# Patient Record
Sex: Female | Born: 1970 | Race: Black or African American | Hispanic: No | Marital: Married | Smoking: Former smoker
Health system: Southern US, Community
[De-identification: ages and names within clinical notes are randomized; demographics above are authoritative.]

## PROBLEM LIST (undated history)

## (undated) DIAGNOSIS — F32A Depression, unspecified: Secondary | ICD-10-CM

## (undated) DIAGNOSIS — R5383 Other fatigue: Secondary | ICD-10-CM

## (undated) DIAGNOSIS — T401X1A Poisoning by heroin, accidental (unintentional), initial encounter: Secondary | ICD-10-CM

## (undated) DIAGNOSIS — M1711 Unilateral primary osteoarthritis, right knee: Secondary | ICD-10-CM

## (undated) DIAGNOSIS — Z1211 Encounter for screening for malignant neoplasm of colon: Secondary | ICD-10-CM

## (undated) DIAGNOSIS — L309 Dermatitis, unspecified: Secondary | ICD-10-CM

## (undated) DIAGNOSIS — K219 Gastro-esophageal reflux disease without esophagitis: Secondary | ICD-10-CM

## (undated) DIAGNOSIS — M79606 Pain in leg, unspecified: Secondary | ICD-10-CM

## (undated) DIAGNOSIS — K259 Gastric ulcer, unspecified as acute or chronic, without hemorrhage or perforation: Secondary | ICD-10-CM

## (undated) DIAGNOSIS — I10 Essential (primary) hypertension: Secondary | ICD-10-CM

## (undated) DIAGNOSIS — F419 Anxiety disorder, unspecified: Secondary | ICD-10-CM

## (undated) DIAGNOSIS — N390 Urinary tract infection, site not specified: Secondary | ICD-10-CM

## (undated) DIAGNOSIS — E049 Nontoxic goiter, unspecified: Secondary | ICD-10-CM

## (undated) DIAGNOSIS — U071 COVID-19: Secondary | ICD-10-CM

## (undated) DIAGNOSIS — T7840XA Allergy, unspecified, initial encounter: Secondary | ICD-10-CM

## (undated) DIAGNOSIS — R0683 Snoring: Secondary | ICD-10-CM

## (undated) DIAGNOSIS — J111 Influenza due to unidentified influenza virus with other respiratory manifestations: Secondary | ICD-10-CM

## (undated) DIAGNOSIS — B977 Papillomavirus as the cause of diseases classified elsewhere: Secondary | ICD-10-CM

## (undated) DIAGNOSIS — Z8619 Personal history of other infectious and parasitic diseases: Secondary | ICD-10-CM

## (undated) DIAGNOSIS — R519 Headache, unspecified: Secondary | ICD-10-CM

## (undated) DIAGNOSIS — G4700 Insomnia, unspecified: Secondary | ICD-10-CM

## (undated) DIAGNOSIS — F329 Major depressive disorder, single episode, unspecified: Secondary | ICD-10-CM

## (undated) DIAGNOSIS — R51 Headache: Secondary | ICD-10-CM

## (undated) DIAGNOSIS — J45909 Unspecified asthma, uncomplicated: Secondary | ICD-10-CM

## (undated) HISTORY — DX: Insomnia, unspecified: G47.00

## (undated) HISTORY — DX: Gastric ulcer, unspecified as acute or chronic, without hemorrhage or perforation: K25.9

## (undated) HISTORY — DX: COVID-19: U07.1

## (undated) HISTORY — DX: Headache: R51

## (undated) HISTORY — DX: Gastro-esophageal reflux disease without esophagitis: K21.9

## (undated) HISTORY — DX: Allergy, unspecified, initial encounter: T78.40XA

## (undated) HISTORY — DX: Dermatitis, unspecified: L30.9

## (undated) HISTORY — PX: GASTRIC BYPASS: SHX52

## (undated) HISTORY — DX: Headache, unspecified: R51.9

## (undated) HISTORY — PX: ROTATOR CUFF REPAIR: SHX139

## (undated) HISTORY — DX: Nontoxic goiter, unspecified: E04.9

## (undated) HISTORY — DX: Snoring: R06.83

## (undated) HISTORY — DX: Encounter for screening for malignant neoplasm of colon: Z12.11

## (undated) HISTORY — DX: Poisoning by heroin, accidental (unintentional), initial encounter: T40.1X1A

## (undated) HISTORY — DX: Pain in leg, unspecified: M79.606

## (undated) HISTORY — DX: Influenza due to unidentified influenza virus with other respiratory manifestations: J11.1

## (undated) HISTORY — PX: CYSTECTOMY: SUR359

## (undated) HISTORY — DX: Urinary tract infection, site not specified: N39.0

## (undated) HISTORY — DX: Personal history of other infectious and parasitic diseases: Z86.19

## (undated) HISTORY — DX: Papillomavirus as the cause of diseases classified elsewhere: B97.7

## (undated) HISTORY — DX: Other fatigue: R53.83

## (undated) HISTORY — DX: Unilateral primary osteoarthritis, right knee: M17.11

---

## 1997-09-19 ENCOUNTER — Other Ambulatory Visit: Admission: RE | Admit: 1997-09-19 | Discharge: 1997-09-19 | Payer: Self-pay | Admitting: Family Medicine

## 1998-03-06 ENCOUNTER — Other Ambulatory Visit: Admission: RE | Admit: 1998-03-06 | Discharge: 1998-03-06 | Payer: Self-pay | Admitting: Obstetrics and Gynecology

## 1998-04-19 ENCOUNTER — Ambulatory Visit (HOSPITAL_COMMUNITY): Admission: RE | Admit: 1998-04-19 | Discharge: 1998-04-19 | Payer: Self-pay | Admitting: Family Medicine

## 1998-04-19 ENCOUNTER — Encounter: Payer: Self-pay | Admitting: Family Medicine

## 1998-08-06 ENCOUNTER — Other Ambulatory Visit: Admission: RE | Admit: 1998-08-06 | Discharge: 1998-08-06 | Payer: Self-pay | Admitting: Obstetrics and Gynecology

## 1999-09-11 ENCOUNTER — Other Ambulatory Visit: Admission: RE | Admit: 1999-09-11 | Discharge: 1999-09-11 | Payer: Self-pay | Admitting: Obstetrics and Gynecology

## 2003-06-22 ENCOUNTER — Other Ambulatory Visit: Admission: RE | Admit: 2003-06-22 | Discharge: 2003-06-22 | Payer: Self-pay | Admitting: Obstetrics and Gynecology

## 2004-01-24 ENCOUNTER — Ambulatory Visit: Payer: Self-pay | Admitting: Family Medicine

## 2005-07-13 ENCOUNTER — Emergency Department (HOSPITAL_COMMUNITY): Admission: EM | Admit: 2005-07-13 | Discharge: 2005-07-13 | Payer: Self-pay | Admitting: Family Medicine

## 2006-08-22 ENCOUNTER — Inpatient Hospital Stay (HOSPITAL_COMMUNITY): Admission: AD | Admit: 2006-08-22 | Discharge: 2006-08-22 | Payer: Self-pay | Admitting: Obstetrics and Gynecology

## 2006-10-28 ENCOUNTER — Encounter: Admission: RE | Admit: 2006-10-28 | Discharge: 2006-11-10 | Payer: Self-pay | Admitting: Obstetrics and Gynecology

## 2006-12-04 ENCOUNTER — Observation Stay (HOSPITAL_COMMUNITY): Admission: AD | Admit: 2006-12-04 | Discharge: 2006-12-05 | Payer: Self-pay | Admitting: Obstetrics and Gynecology

## 2006-12-08 ENCOUNTER — Inpatient Hospital Stay (HOSPITAL_COMMUNITY): Admission: AD | Admit: 2006-12-08 | Discharge: 2006-12-08 | Payer: Self-pay | Admitting: Obstetrics and Gynecology

## 2006-12-11 ENCOUNTER — Inpatient Hospital Stay (HOSPITAL_COMMUNITY): Admission: AD | Admit: 2006-12-11 | Discharge: 2006-12-11 | Payer: Self-pay | Admitting: Obstetrics and Gynecology

## 2006-12-17 ENCOUNTER — Inpatient Hospital Stay (HOSPITAL_COMMUNITY): Admission: RE | Admit: 2006-12-17 | Discharge: 2006-12-17 | Payer: Self-pay | Admitting: Obstetrics and Gynecology

## 2006-12-18 ENCOUNTER — Inpatient Hospital Stay (HOSPITAL_COMMUNITY): Admission: AD | Admit: 2006-12-18 | Discharge: 2006-12-18 | Payer: Self-pay | Admitting: Obstetrics and Gynecology

## 2006-12-26 ENCOUNTER — Inpatient Hospital Stay (HOSPITAL_COMMUNITY): Admission: AD | Admit: 2006-12-26 | Discharge: 2006-12-31 | Payer: Self-pay | Admitting: Obstetrics and Gynecology

## 2006-12-27 ENCOUNTER — Encounter (INDEPENDENT_AMBULATORY_CARE_PROVIDER_SITE_OTHER): Payer: Self-pay | Admitting: Obstetrics and Gynecology

## 2007-03-01 ENCOUNTER — Encounter: Admission: RE | Admit: 2007-03-01 | Discharge: 2007-03-01 | Payer: Self-pay | Admitting: Internal Medicine

## 2009-08-31 ENCOUNTER — Emergency Department (HOSPITAL_COMMUNITY): Admission: EM | Admit: 2009-08-31 | Discharge: 2009-08-31 | Payer: Self-pay | Admitting: Emergency Medicine

## 2009-10-08 ENCOUNTER — Encounter: Admission: RE | Admit: 2009-10-08 | Discharge: 2009-10-08 | Payer: Self-pay | Admitting: Obstetrics and Gynecology

## 2010-04-22 ENCOUNTER — Encounter: Payer: Self-pay | Admitting: Obstetrics and Gynecology

## 2010-05-01 HISTORY — PX: MULTIPLE TOOTH EXTRACTIONS: SHX2053

## 2010-08-13 NOTE — H&P (Signed)
NAME:  Adrienne Savage, Adrienne Savage                 ACCOUNT NO.:  [id number]   MEDICAL RECORD NO.:  [id number]          PATIENT TYPE:  INP   LOCATION:  9152                          FACILITY:  [facility]   PHYSICIAN:  Osborn Coho, M.D.   DATE OF BIRTH:  November 07, 1970   DATE OF ADMISSION:  12/04/2006  DATE OF DISCHARGE:                              HISTORY & PHYSICAL   This is a 40 year old gravida 4, para 1, 0-2-0 at 74 4/7 weeks with  gestational diabetes on glyburide with fetal testing in the office BPP  of 4/10.  Patient was sent here for further testing and close  observation.  She reports feeling occasional contractions, no bleeding.  She has had congestion and cough for two days.  Pregnancy has been  followed by Dr. Stefano Gaul and remarkable for:  1. ANA.  2. Obesity.  3. Smoker.  4. History of anxiety.  5. History of abnormal Pap with cryo.  6. History of stillbirth at term.  7. History of abruption.   ALLERGIES:  ALL THE TETRACYCLINES.   OB HISTORY:  Remarkable for induced abortions in 1988 and 1992.  She had  a stillbirth vaginal delivery in 1993 related to placental abruption of  a female infant at 40-weeks gestation weighing 5 pounds, 11 ounces.   MEDICAL HISTORY:  Remarkable for stillbirth with abruption.  Uterine  infection after that birth.  History of abnormal Pap with cryo surgery  in 1999.  Childhood varicella.  History of constipation.  History of  migraines.  Cigarette smoker.  History of anxiety.   SURGICAL HISTORY:  Remarkable for cryo surgery.   FAMILY HISTORY:  Remarkable for a mother and grandmother with  hypertension.  A grandfather with emphysema.  A mother with  hypothyroidism.  A grandfather with prostate cancer.   GENETIC HISTORY:  Remarkable for father of the baby's daughter with  autism and with twins.   SOCIAL HISTORY:  Patient is single.  Father of the baby, Alwyn Ren,  is involved and supportive.  She does not report a religious  affiliation.  She denies any  alcohol or drug abuse but does smoke  cigarettes half pack per day.   She has prenatal labs: Hemoglobin 11.1, platelets 283, blood type O  positive.  Antibody screen negative.  Sickle cell negative.  Toxo  negative.  RPR nonreactive.  Rubella immune.  Hepatitis negative.  HIV  negative.  Pap test normal.  Gonorrhea negative.  Chlamydia negative.   HISTORY OF CURRENT PREGNANCY:  Patient entered care at 13-weeks  gestation.  She was advised to stop her Xanax for her anxiety and start  counseling.  She had amniocentesis for genetics in May.  An ultrasound  at 18-weeks that was normal.  She had a Glucola at 20-weeks that was  elevated and three hour GTT was normal.  She had ultrasound at 27-weeks  that was normal except for polyhydramnios with an AFI of 26.2 and growth  of 86 percentile.  [facility] and diagnosed with diabetes.  She had another  ultrasound  at 29-weeks showing normal AFI of 19.1.  Fasting blood sugars  were 96 to 120.  Two hours PC's were less than 160.  She had a  biophysical profile at 31 weeks that was 8/8 with AFI of 23.0.  Her  blood sugars were running higher.  She was counseled on medications and  elected to use glyburide.  She began twice weekly NFGs.  [facility]  [facility] 32-[facility] for some tingling.  She had an  ultrasound at that time which was normal and a BPP 8/8.  Plan was made  to proceed with amnio delivery at 37 weeks and then she presented today  in the office with above stated biophysical profile.   OBJECTIVE DATA:  VITAL SIGNS:  Stable, afebrile.  Blood pressure 140/80.  HEENT: Within normal limits.  NECK:  Thyroid normal and not enlarged.  CHEST:  Clear to auscultation.  HEART:  Regular rate and rhythm.  ABDOMEN:  Gravid at 35 centimeters.  Fetal heart tones were 120 with  variability and accelerations and no decelerations.  There are  occasional contractions.  PELVIC:   Deferred.  EXTREMITIES:  Within normal limits.   ASSESSMENT:  1. Intrauterine pregnancy at 34 4/7 weeks.  2. Gestational diabetes.  3. Biophysical profile score of 4/10.   PLAN:  1. Admit to antenatal for observation.  2. PIH labs and urinalysis.  3. Glyburide and CBG testing.  4. Fetal heart rate monitoring and further orders to follow.      Marie L. Williams, C.N.M.      Osborn Coho, M.D.  Electronically Signed    MLW/MEDQ  D:  12/04/2006  T:  12/04/2006  Job:  [id number]

## 2010-08-13 NOTE — Discharge Summary (Signed)
NAMEMARYCLARE, NYDAM                 ACCOUNT NO.:  [id number]   MEDICAL RECORD NO.:  [id number]          PATIENT TYPE:  INP   LOCATION:  9169                          [facility]  [facility]   PHYSICIAN:  Janine Limbo, M.D.DATE OF BIRTH:  [birth year]   DATE OF ADMISSION:  12/17/2006  DATE OF DISCHARGE:  12/17/2006                               DISCHARGE SUMMARY   ADMISSION DIAGNOSES:  1. Intrauterine pregnancy at 36-3/7 weeks.  2. Status post amniocentesis for fetal lung maturity.  3. Gestation diabetes on glyburide.  4. Pregnancy induced hypertension on Aldomet.  5. Nonreactive non-stress test.   DISCHARGE DIAGNOSES:  1. Reactive non-stress test.  2. Pregnancy induced hypertension.  3. Gestation diabetes.  4. Amniocentesis results positive for LS ratio, negative PG.   PROCEDURE:  Amniocentesis.   HOSPITAL COURSE:  [name] is a 40 year old gravida 4, para 1, 0, 2, 0  at 36-3/7 weeks who presented status post amniocentesis by Dr. Stefano Gaul  for fetal lung maturity. Patient has a history of a term IUFD with fetal  heart rate nonreactive. After amniocentesis it then became reactive and  amniocentesis results were returned.  There was an appropriate LS ratio  but had neg PG.  Dr. Stefano Gaul saw the patient and the decision was made  in light of the reactive fetal heart rate tracing to send the patient  home.   DISCHARGE CONDITION:  Stable.   DISCHARGE INSTRUCTIONS:  Patient is to monitor fetal kick counts and the  usual precautions.   DISCHARGE MEDICATIONS:  1. Glyburide 2.5 mg p.o. daily.  2. Aldomet 500 mg p.o. b.i.d.  3. Prenatal vitamin one p.o. daily.   DISCHARGE FOLLOW UP:  Will occur on the patient's admission for  induction on Sunday night, and will continue to monitor her fasting  blood sugar and 2 hour PCs.      Renaldo Reel Emilee Hero, C.N.M.      Janine Limbo, M.D.  Electronically Signed    VLL/MEDQ  D:  12/17/2006  T:  12/17/2006  Job:  [id number]

## 2010-08-13 NOTE — Discharge Summary (Signed)
Adrienne Savage, Adrienne Savage                 ACCOUNT NO.:  [id number]   MEDICAL RECORD NO.:  [id number]          PATIENT TYPE:  INP   LOCATION:  9152                          [facility]   PHYSICIAN:  Osborn Coho, M.D.   DATE OF BIRTH:  December 12, 1970   DATE OF ADMISSION:  12/04/2006  DATE OF DISCHARGE:  12/05/2006                               DISCHARGE SUMMARY   ADMITTING DIAGNOSES:  1. Intrauterine pregnancy at 56 and 4/7th weeks.  2. Gestational diabetes.  3. Biophysical profile of 4 out of 10.   DISCHARGE DIAGNOSES:  1. Intrauterine pregnancy at 82 and 5/7th weeks.  2. Gestational diabetes.  3. Biophysical profile score of 10 out of 10.   HOSPITAL COURSE:  Adrienne Savage is a 40 year old, gravida 4, para 1-0-2-0,  who was admitted at 41 and 4/7th weeks with gestational diabetes on  Glyburide with fetal testing in the office showing BPP score of 4 out of  10.  The patient was sent here for further testing and close  observation.  She felt occasional contractions with no bleeding.  Her  pregnancy has been followed by [facility] and  has been remarkable for:  1)  AMA; 2)  Obesity; 3)  Smoker; 4)  History  of anxiety; 5)  History of abnormal Pap with cryo; 6)  History of  stillbirth at term; 7)  History of abruption.  Upon admission, her vital  signs were stable.  Temperature was 98.5, blood pressure was 140/80,  fetal heart rate monitoring was reactive, toco showed irregular  contractions, her physical exam was within normal limits.  The plan was  to recheck her BPP as well as check lab work due to mildly increased  blood pressure.  Her followup PTT on the evening of September 5th showed  8 out of 8.  On the morning of September 6th, the patient was without  complaint, vital signs were stable, temperature was 98.4, blood pressure  128/80, her blood sugar after dinner on the evening of the 5th was 129,  fetal heart rate tracing remained reactive and reassuring,  irregular  toco.  Her urinalysis was negative for protein.  All of her PIH labs  were within normal limits.  Plan was made for a repeat biophysical  profile on the 6th and if that was 8 out of 8 with reactive NST the  patient would be discharged home with further followup in the [facility] office.  BPP on the 6th was 8 out of 8 with fluid AFI 16.8,  which was at the 62nd percentile, vertex presentation, fetal heart  tracing remained reactive, and it was deemed that the patient has  received her full benefit for hospital stay and she was discharged home.   DISCHARGE INSTRUCTIONS:  Follow up at [facility] as scheduled on  September 9th for NST.  Fetal kick counts were reviewed.  Labor  precautions and PIH precautions were reviewed with the patient.   DISCHARGE MEDICATIONS:  Prenatal vitamins one p.o. daily.      Cam Hai, C.N.M.  Osborn Coho, M.D.  Electronically Signed    KS/MEDQ  D:  12/05/2006  T:  12/05/2006  Job:  [id number]

## 2010-08-13 NOTE — Discharge Summary (Signed)
Adrienne Savage, Adrienne Savage                 ACCOUNT NO.:  [id number]   MEDICAL RECORD NO.:  [id number]          PATIENT TYPE:  INP   LOCATION:  9169                          [facility]   PHYSICIAN:  Janine Limbo, M.D.DATE OF BIRTH:  1970-08-18   DATE OF ADMISSION:  12/17/2006  DATE OF DISCHARGE:  12/17/2006                               DISCHARGE SUMMARY   ADMISSION DIAGNOSES:  1. Intrauterine pregnancy at 36-3/7 weeks.  2. Status post amniocentesis.  3. History of intrauterine fetal demise.  4. Gestational diabetes on Glyburide.  5. Pregnancy-induced hypertension on Aldomet.   DISCHARGE DIAGNOSES:  1. Intrauterine pregnancy at 36-3/7 weeks.  2. Status post amniocentesis.  3. History of intrauterine fetal demise.  4. Gestational diabetes on Glyburide.  5. Pregnancy-induced hypertension on Aldomet.  6. Positive LS ratio but negative PG.   PROCEDURE:  Amniocentesis for fetal lung maturity.   HOSPITAL COURSE:  The patient is a 40 year old gravida 4, para 1-0-2-0,  at 36-3/7 weeks who presented to the hospital status post amniocentesis  by Dr. Stefano Gaul for fetal lung maturity.  This was done secondary to the  patient's history of term IUFD, gestational diabetes, and PIH.  Fetal  heart rate was nonreactive after the amniocentesis and the patient was  therefore admitted for some observation while she was awaiting  amniocentesis results.  The pregnancy was remarkable for:  1. Advanced maternal age with normal amniocentesis at 17 weeks.  2. PIH on Aldomet.  3. Gestational diabetes on Glyburide.  4. Obesity.  5. History of cryo.  6. On Xanax early in pregnancy.  7. Smoker.   NO FURTHER DICTATION      Chip Boer L. Emilee Hero, C.N.M.      Janine Limbo, M.D.  Electronically Signed    VLL/MEDQ  D:  12/17/2006  T:  12/18/2006  Job:  [id number]

## 2010-08-13 NOTE — Discharge Summary (Signed)
[name], [name]              ACCOUNT NO.:  [id number]   MEDICAL RECORD NO.:  [id number]          PATIENT TYPE:  INP   LOCATION:  9148                          [facility]   PHYSICIAN:  Osborn Coho, M.D.   DATE OF BIRTH:  29-Jun-1970   DATE OF ADMISSION:  12/26/2006  DATE OF DISCHARGE:  12/31/2006                               DISCHARGE SUMMARY   ADMITTING DIAGNOSES:  1. Intrauterine pregnancy at 37-1/2 weeks.  2. History of term intrauterine fetal demise.  3. Gestational hypertension.  4. Gestational diabetes on glyburide.  5. Obesity.  6. Advanced maternal age.  7. History of anxiety.   DISCHARGE DIAGNOSES:  1. Thirty-seven and a half weeks.  2. Diabetes, gestational on glyburide.  3. Gestational hypertension on Aldomet 500 mg p.o. b.i.d.  4. Obesity.  5. History of term intrauterine fetal demise.  6. Nonreassuring fetal heart rate.   PROCEDURES:  Primary low transverse cesarean section.   HOSPITAL COURSE:  [name] Savage is a 40 year old gravida 4, para 1-0-2-0- who  presented at 37-1/[redacted] weeks gestation for induction.  Pregnancy had been  remarkable for (1) History of term intrauterine fetal demise associated  with an abruption.  (2) History of gestational diabetes on glyburide.  (3) Gestational hypertension on Aldomet twice daily.  (4) Amniocentesis  1 week ago with borderline lung maturity.  The patient was admitted to  the hospital; Cytotec was given, and the patient was begun on Pitocin.  Her blood pressures on admission were 164/95.  She had a migraine on the  morning of September 28, was given some Stadol, Phenergan, and Vicodin.  At that time, her cervix was 1-2, 75% vertex at a -2 station.  PIH labs  were normal, and her glucose was normal.  Epidural was placed at 11:50  on that morning.  At that time, she was 2-3.  Artificial rupture of  membranes was accomplished with clear fluid noted.  By the evening of  September 28, the patient had a prolonged  deceleration.  Cervix at that  time was 8, 75% vertex at a 0 station.  Decelerations were occurring  with Pitocin, and Dr. Stefano Gaul reviewed the management options, and the  patient elected to proceed with cesarean section.  She was taken to the  operating room where a primary low transverse cesarean section was  performed by Dr. Stefano Gaul.  Findings were a viable female by the name of  Adrienne Savage, weight 5 pounds 14 ounces.  Apgars were 8 and 9.  Cord pH was  7.28.  The infant was taken to the full-term nursery.  Mother was taken  to recovery in good condition.  She did have a JP drain placed.  By  postop day 1, the patient was doing well.  Her hemoglobin was 9.7, white  blood cell count 12.8, and platelet count was 167.  CBG fasting was 82.  Basic metabolic panel was normal.  By postop day 2, she was still on  Aldomet 500 p.o. b.i.d., and HCTZ was added.  Her blood pressure did  have some sporadic elevations.  CBG assessment was discontinued.  Postop  day 3, she was doing well, breast-feeding with supplementation for blood  sugar stability was undergoing.  On day 3, the patient's blood pressures  were in the 140s-150s/80s to low 90s.  Her physical exam was within  normal limits.  Her JP drain was removed without difficulty.  She had  lost 2.5 pounds of weight.  The decision was made to keep her an  additional day.  On postop day 4, the patient was doing well.  She was  up ad lib.  Infant may have to stay another day, but the patient was  ready to be discharged.  Her blood pressure was 145/82.  Max blood  pressure had been 158/102 in the evening night before.  Weight was 230  pounds down from 234.2.  her incision was clean, dry, and intact.  Her  bleeding was within normal limits.  Extremities:  Deep tendon reflexes  are 2+ without clonus.  There is 1+ edema noted.  There is negative  Homan sign noted.  The patient was deemed to have received full benefit  of her hospital stay and was  discharged home.   DISCHARGE INSTRUCTIONS:  Per [facility] handout.  PIH  precautions were also reviewed.   DISCHARGE MEDICATIONS:  1. Motrin 600 mg p.o. q.6h. p.r.n. pain.  2. Tylox 1-2 p.o. q.3-4h. p.r.n. pain.  3. Aldomet 500 mg p.o. b.i.d.  4. HCTZ 25 mg 1 p.o. daily.   Discharge follow up will occur by [facility] [employer] nurse visiting the patient  early next week and then it will be a 6 weeks postpartum visit at  [facility] as well as any p.r.n. visits based upon her  findings.      Adrienne Savage Savage, C.N.M.      Osborn Coho, M.D.  Electronically Signed    VLL/MEDQ  D:  12/31/2006  T:  12/31/2006  Job:  [id number]

## 2010-08-13 NOTE — Op Note (Signed)
NAME:  Adrienne Savage, Adrienne Savage              ACCOUNT NO.:  [id number]   MEDICAL RECORD NO.:  [id number]          PATIENT TYPE:  INP   LOCATION:  9148                          [facility]   PHYSICIAN:  Janine Limbo, M.D.DATE OF BIRTH:  1970-10-23   DATE OF PROCEDURE:  12/27/2006  DATE OF DISCHARGE:                               OPERATIVE REPORT   PREOPERATIVE DIAGNOSIS:  1. 37-1/[redacted] weeks gestation.  2. Diabetes mellitus.  3. Hypertension.  4. Obesity (height 5 feet 5 inches, weight 225 pounds).  5. History of a term intrauterine fetal demise.  6. Nonreassuring fetal heart rate tracing.   POSTOPERATIVE DIAGNOSIS:  1. 37-1/[redacted] weeks gestation.  2. Diabetes mellitus.  3. Hypertension.  4. Obesity (height 5 feet 5 inches, weight 225 pounds).  5. History of a term intrauterine fetal demise.  6. Nonreassuring fetal heart rate tracing.  7. Occiput posterior presentation.  8. Shoulder cord.  9. Fibroid uterus.   PROCEDURE:  Primary low transverse cesarean section.   SURGEON:  Dr. Leonard Schwartz.   FIRST ASSISTANT:  Philipp Deputy, CNM.   ANESTHETIC:  Epidural.   DISPOSITION:  Adrienne Savage is a 40 year old female, gravida 4, para 1-0-2-0,  who presents with the above-mentioned diagnosis.  The patient  understands the indications for her surgical procedure and she accepts  the risks of, but not limited to, anesthetic complications, bleeding,  infection, and possible damage to surrounding organs.   FINDINGS:  A 5 pounds 14 ounces female infant Garlon Hatchet) was delivered  from an occiput posterior presentation.  There was a shoulder cord  present.  The Apgars were 8 and one minute and 9 at five minutes.  The  arterial cord blood pH was 7.28.  The fallopian tubes and ovaries  appeared normal.  The uterus was normal except that there was a 2 cm  fibroid present on the left anterior fundus.   PROCEDURE:  The patient was taken to the operating room where it was  determined that the epidural  she had received for labor would be  adequate for cesarean delivery.  The patient's abdomen was prepped with  multiple layers of Betadine and then sterilely draped.  The lower  abdomen was injected with 10 mL of 0.5% Marcaine with epinephrine.  A  low transverse incision was made in the abdomen and carried sharply  through the subcutaneous tissue, fascia, and the anterior peritoneum.  The bladder flap was developed.  An incision was made in the lower  uterine segment and extended in a low transverse fashion.  The fetal  head was delivered.  A shoulder cord was noted.  The shoulder cord was  reduced.  The remainder of the infant was then delivered.  The cord was  clamped and cut and the infant was handed to the awaiting pediatric  team.  Routine cord blood studies were obtained.  The placenta was  removed.  The uterine cavity was cleaned of amniotic fluid, clotted  blood, and membranes.  The uterine incision was closed using a running  locking suture of 2-0 Vicryl followed by an imbricating suture of 2-0  Vicryl.  Hemostasis was adequate.  The pelvis was irrigated.  The  anterior peritoneum and abdominal musculature were reapproximated in the  midline using 2-0 Vicryl.  The fascia was closed using a running suture  of 0 Vicryl followed by three interrupted sutures of 0 Vicryl.  A size 7  flat Jackson-Pratt drain was placed in the subcutaneous layer and  brought out through the left lower quadrant.  It was sutured into place  using 3-0 silk.  The subcutaneous layer was closed using a running  suture of 0 Vicryl.  The skin was reapproximated using a subcuticular  suture of 3-0 Monocryl.  Sponge, needle, instrument counts were correct  on two occasions.  Estimated blood loss was 700 mL.  The patient  tolerated her procedure well.  She was taken to the recovery room in  stable condition.  She was noted to drain clear yellow urine.  The  infant was taken to the full-term nursery in stable  condition.  The  placenta was sent to pathology.      Janine Limbo, M.D.  Electronically Signed     AVS/MEDQ  D:  12/27/2006  T:  12/28/2006  Job:  [id number]

## 2010-08-13 NOTE — H&P (Signed)
NAME:  Adrienne Savage, Adrienne Savage NO.:  [id number]   MEDICAL RECORD NO.:  [id number]          PATIENT TYPE:  INP   LOCATION:                                 FACILITY:   PHYSICIAN:  Janine Limbo, M.D.DATE OF BIRTH:  1971-02-06   DATE OF ADMISSION:  12/25/2006  DATE OF DISCHARGE:                              HISTORY & PHYSICAL   HISTORY OF PRESENT ILLNESS:  [name] is a 40 year old female, gravida  4, para 1-0-2-0, who presents at 37-1/2 weeks' gestation ([facility] is January [facility], 2007).  The patient is followed at the [facility]  and [facility] of [facility].  This  pregnancy has been complicated by the fact that the patient has had a  term intrauterine fetal demise.  She also has a history of anxiety.  She  has had gestational diabetes during this pregnancy and her blood sugars  have been well-controlled on glyburide 2.5 mg each day.  She has  gestational hypertension and she currently takes Aldomet 500 mg twice  each day.  She has had antenatal fetal testing that has been reassuring.  The patient had an amniocentesis one week ago and her infant was noted  to be slightly immature.   OBSTETRICAL HISTORY:  The patient had a term intrauterine fetal demise  in 1993 associated with a placental abruption.  She had two elective  pregnancy terminations in the first trimester.   DRUG ALLERGIES:  The patient says that she is allergic to all  medications that end in CYCLINE.   SOCIAL HISTORY:  The patient denies cigarette use, alcohol use, and  recreational drug use.   REVIEW OF SYSTEMS:  Normal pregnancy complaints.   FAMILY HISTORY:  Noncontributory.   PHYSICAL EXAM:  Weight is 238 pounds.  HEENT:  Within normal limits.  CHEST:  Clear.  HEART:  Regular rate and rhythm.  BREASTS:  Without masses.  ABDOMEN:  Gravid with a fundal height of 36 cm.  EXTREMITIES:  Grossly normal.  NEUROLOGIC:  Grossly normal.  PELVIC:  The cervix  was fingertip dilated, 50% effaced, and -2 in  station.   LABORATORY VALUES:  Blood type is O positive.  Antibody screen negative.  Sickle screen negative.  Toxoplasmosis negative.  VDRL nonreactive.  Rubella immune.  HBSAG negative.  HIV nonreactive.  Pap smear within  normal limits.  Glucola screen shows gestational diabetes.  Genetic  amniocentesis showed normal chromosome complement.  Third trimester beta  strep is negative.   ASSESSMENT:  1. Thirty-seven and a half week gestation.  2. History of term intrauterine fetal demise.  3. Gestational hypertension.  4. Gestational diabetes.  5. Obesity.  6. Advanced maternal age.  7. History of anxiety.   PLAN:  The patient will be admitted for induction of labor.  We will  give the patient Cytotec and then start the patient on Pitocin.      Janine Limbo, M.D.  Electronically Signed     AVS/MEDQ  D:  12/24/2006  T:  12/24/2006  Job:  [id number]

## 2010-08-13 NOTE — H&P (Signed)
Adrienne Savage, Adrienne Savage                 ACCOUNT NO.:  [id number]   MEDICAL RECORD NO.:  [id number]          PATIENT TYPE:  INP   LOCATION:  9169                          FACILITY:  [facility]   PHYSICIAN:  Janine Limbo, M.D.DATE OF BIRTH:  Feb 09, 1971   DATE OF ADMISSION:  12/17/2006  DATE OF DISCHARGE:  12/17/2006                              HISTORY & PHYSICAL   [name] is a 40 year old gravida 4, para 1-0-2-0 at 36-3/7 weeks who  presented status post amnio by Dr. Stefano Gaul today for fetal lung  maturity.  This is done secondary to a history of term IUFD.  In the  maternity admissions unit, the fetal heart rate was nonreactive after  the amnio, although it was not nonreassuring.  The decision was made to  admit her for continued observation until amniocentesis results are  returned.   Pregnancy has been remarkable for:  1. Advanced maternal age with normal amnio.  2. PIH, on Aldomet.  3. Gestational diabetes, on Glyburide.  4. Obesity.  5. History of cryo.  6. History on Xanax early in pregnancy.  7. Smoker.   PRENATAL LABS:  Blood type is O+.  Rh antibody negative.  VDRL  nonreactive.  Rubella titer positive.  Hepatitis B surface antigen  negative.  HIV was nonreactive.  Sickle cell test was negative.  Toxoplasmosis titers were negative/negative.  GC and Chlamydia cultures  were negative in April.  Pap was normal in January, 2008.  Hemoglobin  upon entering the practice was 11.1.  It was within normal limits at 28  weeks.   Patient had an amnio and had normal findings.  She had the Glucola  screen at 20 weeks, which was elevated at 189.  She had a 3-hour GTT  that showed gestational diabetes.  That was diagnosed by 27 weeks.  A  Group B strep culture is not noted on her chart today.  During her  pregnancy, most of her fastings have been less than 100, and most 2-hour  PC's have been less than 130.   HISTORY OF PRESENT PREGNANCY:  Patient entered care at approximately 13  weeks.  She elected an amnio.  She was having a lot of anxiety and was  taking Xanax.  She had been seeing a counselor in the past and was  recommended to follow up with them.  She had an amnio at approximately  17 weeks without difficulty.  Findings were normal.  She had another  ultrasound at 18 weeks showing normal growth.  Glucola screen was done  at 20 weeks.  It was elevated.  She did have a low-lying placenta noted  on May 24.  Her 1-hour Glucola was 189.  She had a 3-hour GTT at that  time.  That was normal; however, she had polyhydramnios diagnosed at 25  weeks with an AFI of 26.7.  She was sent to nutritional management  center for diet control and diet instructions.  She also was measuring  large with the fetus measuring at the 86 percentile.  She was started on  CBGs and was followed through the  pregnancy.  AFI was 27 weeks was 26.2.  By 29 weeks, AFI had come down to the 75th percentile.  She began BPP at  31 weeks with a complete ultrasound and has twice weekly since then.  At  31 weeks, she was officially diagnosed with gestational diabetes  and  polyhydramnios.  AFI at that time was 23.03.  Glyburide versus insulin  were discussed at that time secondary to her blood sugars being  elevated.  Patient chose glyburide.   She had another ultrasound at 33 weeks showing normal growth and normal  fluid.  Blood pressure at 33 weeks was 124/90.  She had elevated fluid  at 33 weeks with an AFI of 26.5.  By 34 weeks, she was still maintaining  a blood pressure of 130/90.  She was sent to the hospital for 23-hour  observation.  She was placed on Aldomet at that time and is now on 500  b.i.d.  She had a BPP at 34 weeks of 4/10, -2 for breathing and low  fluid.   The rest of her pregnancy has been essentially uncomplicated.  She was  scheduled for an amnio today to determine whether she was going to be  able to be delivered at 37 weeks.   OBSTETRICAL HISTORY:  In 1988, she had a TAB  at eight weeks.  In 1992,  she had a TAB at six weeks.  In 1993, she had a stillborn at 40 weeks.  It was a female infant, weight 5 pounds, 11 ounces.  It was noted to have  a placental abruption.  This was diagnosed while the patient was living  in [place], and she was induced.  There was an infection of her uterus  after birth.   MEDICAL HISTORY:  She is on birth control pills, condoms, diaphragm, and  uses the sponge.  She had an abnormal Pap and had a colposcopy, which  fine after that.  She had cryo in 1999.  She does have a history of  constipation.  She has a history of migraines.  She is a smoker.  She  was hospitalized after her last birth for endometritis.  She does have a  history of anxiety and was taking Xanax before delivery.   FAMILY HISTORY:  Her mother and maternal grandmother had hypertension.  Her maternal grandfather had emphysema.  Her mother has hypothyroidism.  Her maternal grandfather had prostate cancer.  Her mother and her  grandfather were smokers.   PAST SURGICAL HISTORY:  Includes the previously noted TAB x2.   GENETIC HISTORY:  Remarkable for the father of the baby having a  daughter who is autistic and mentally retarded.  The father of the baby  also has twins that run in his family.   SOCIAL HISTORY:  Patient is single.  The father of the baby is involved  and supportive.  His name is Alwyn Ren.  Patient is college  educated.  She is a case Production designer, theatre/television/film.  Her husband has two years of college.  He is a Merchandiser, retail.  She has been followed by the physician's service at  [facility].  She denies any drug or alcohol use during this  pregnancy.  She has been an approximately half-pack-per-day smoker.   PHYSICAL EXAMINATION:  VITAL SIGNS:  Blood pressure is 141/95, pulse 77,  temp 98, respirations 20.  CBG is 80.  This is right before lunch.  Fetal monitor is nonreactive.  There are no decelerations, but there are  no classically reactive segments.   Uterine contractions are irregular  and mild.  EXTREMITIES:  Deep tendon reflexes are 2+ without clonus.  There is  trace edema noted.  ABDOMEN:  Soft, nontender, gravid.  There is no significant sensitivity  over the amnio site and no vaginal bleeding.   IMPRESSION:  1. Intrauterine pregnancy at 36-2/7 weeks.  2. History of fetal demise at term.  3. Pregnancy-induced hypertension.  4. Gestational diabetes, on glyburide.  5. Nonreactive NST.   PLAN:  1. Admit to birthing suite for observation.  2. Plan to await amnio results and continuously monitor.  3. Glyburide 2.5 mg p.o.  4. Aldomet 500 mg p.o. b.i.d.  5. MD's will follow.  6. Dr. Stefano Gaul will re-evaluate the patient later today, once amnio      results are received.      Renaldo Reel Emilee Hero, C.N.M.      Janine Limbo, M.D.  Electronically Signed    VLL/MEDQ  D:  12/17/2006  T:  12/17/2006  Job:  [id number]

## 2010-09-13 ENCOUNTER — Other Ambulatory Visit: Payer: Self-pay | Admitting: Internal Medicine

## 2010-09-13 DIAGNOSIS — Z1231 Encounter for screening mammogram for malignant neoplasm of breast: Secondary | ICD-10-CM

## 2010-10-10 ENCOUNTER — Ambulatory Visit: Payer: Self-pay

## 2011-01-01 ENCOUNTER — Inpatient Hospital Stay (INDEPENDENT_AMBULATORY_CARE_PROVIDER_SITE_OTHER)
Admission: RE | Admit: 2011-01-01 | Discharge: 2011-01-01 | Disposition: A | Discharge status: Home / Self Care | Payer: 59 | Source: Ambulatory Visit | Attending: Family Medicine | Admitting: Family Medicine

## 2011-01-01 ENCOUNTER — Ambulatory Visit (INDEPENDENT_AMBULATORY_CARE_PROVIDER_SITE_OTHER): Payer: 59

## 2011-01-01 DIAGNOSIS — J019 Acute sinusitis, unspecified: Secondary | ICD-10-CM

## 2011-01-01 DIAGNOSIS — R6889 Other general symptoms and signs: Secondary | ICD-10-CM

## 2011-01-01 DIAGNOSIS — J45909 Unspecified asthma, uncomplicated: Secondary | ICD-10-CM

## 2011-01-01 DIAGNOSIS — I10 Essential (primary) hypertension: Secondary | ICD-10-CM

## 2011-01-09 LAB — CBC
HCT: 28.7 — ABNORMAL LOW
HCT: 31.3 — ABNORMAL LOW
HCT: 31.6 — ABNORMAL LOW
HCT: 32.5 — ABNORMAL LOW
HCT: 33.7 — ABNORMAL LOW
Hemoglobin: 10.8 — ABNORMAL LOW
Hemoglobin: 11.2 — ABNORMAL LOW
MCHC: 33.9
MCHC: 34.5
MCV: 85.9
MCV: 86.4
MCV: 87.2
Platelets: 167
Platelets: 208
Platelets: 235
RBC: 3.29 — ABNORMAL LOW
RBC: 3.62 — ABNORMAL LOW
RBC: 3.78 — ABNORMAL LOW
WBC: 10.5
WBC: 11.9 — ABNORMAL HIGH
WBC: 7.9

## 2011-01-09 LAB — COMPREHENSIVE METABOLIC PANEL
ALT: 13
Albumin: 2.2 — ABNORMAL LOW
Albumin: 2.4 — ABNORMAL LOW
BUN: 6
BUN: 7
CO2: 22
CO2: 24
Chloride: 107
Chloride: 109
Creatinine, Ser: 0.68
Creatinine, Ser: 0.68
GFR calc Af Amer: >60
GFR calc Af Amer: >60
GFR calc non Af Amer: >60
Glucose, Bld: 126 — ABNORMAL HIGH
Glucose, Bld: 82
Potassium: 3.7
Potassium: 3.7
Total Bilirubin: 0.6
Total Protein: 5.9 — ABNORMAL LOW

## 2011-01-09 LAB — BASIC METABOLIC PANEL
BUN: 4 — ABNORMAL LOW
CO2: 25
Chloride: 105
Creatinine, Ser: 0.84
Potassium: 4.1

## 2011-01-09 LAB — URINALYSIS, DIPSTICK ONLY
Bilirubin Urine: NEGATIVE
Glucose, UA: NEGATIVE
Leukocytes, UA: NEGATIVE
Nitrite: NEGATIVE
Protein, ur: NEGATIVE
Urobilinogen, UA: 0.2

## 2011-01-09 LAB — URINALYSIS, ROUTINE W REFLEX MICROSCOPIC
Specific Gravity, Urine: 1.025
pH: 6

## 2011-01-09 LAB — LACTATE DEHYDROGENASE: LDH: 169

## 2011-01-09 LAB — RPR: RPR Ser Ql: NONREACTIVE

## 2011-01-09 LAB — LSPG (L/S RATIO WITH PG)-AMNIO FLUID

## 2011-01-10 LAB — CBC
MCHC: 34.3
MCV: 86.4
Platelets: 241
RBC: 3.6 — ABNORMAL LOW

## 2011-01-10 LAB — URINALYSIS, DIPSTICK ONLY
Bilirubin Urine: NEGATIVE
Nitrite: NEGATIVE
Protein, ur: NEGATIVE
Specific Gravity, Urine: >1.03 — ABNORMAL HIGH
Urobilinogen, UA: 0.2

## 2011-01-10 LAB — COMPREHENSIVE METABOLIC PANEL
ALT: 14
AST: 20
Albumin: 2.4 — ABNORMAL LOW
CO2: 22
Calcium: 8.6
Creatinine, Ser: 0.79
GFR calc Af Amer: >60
Sodium: 136
Total Protein: 6

## 2011-06-10 ENCOUNTER — Other Ambulatory Visit: Payer: Self-pay | Admitting: Orthopedic Surgery

## 2011-06-16 ENCOUNTER — Encounter (HOSPITAL_BASED_OUTPATIENT_CLINIC_OR_DEPARTMENT_OTHER): Payer: Self-pay | Admitting: *Deleted

## 2011-06-16 NOTE — Progress Notes (Signed)
To come in for bmet-called for ekg To bring meds when come in

## 2011-06-20 ENCOUNTER — Encounter (HOSPITAL_BASED_OUTPATIENT_CLINIC_OR_DEPARTMENT_OTHER)
Admission: RE | Admit: 2011-06-20 | Discharge: 2011-06-20 | Disposition: A | Discharge status: Home / Self Care | Payer: 59 | Source: Ambulatory Visit | Attending: Orthopedic Surgery | Admitting: Orthopedic Surgery

## 2011-06-20 ENCOUNTER — Encounter (HOSPITAL_BASED_OUTPATIENT_CLINIC_OR_DEPARTMENT_OTHER): Payer: Self-pay | Admitting: *Deleted

## 2011-06-20 LAB — BASIC METABOLIC PANEL
BUN: 18 mg/dL (ref 6–23)
CO2: 28 mEq/L (ref 19–32)
Chloride: 100 mEq/L (ref 96–112)
GFR calc non Af Amer: 62 mL/min — ABNORMAL LOW (ref >90)
Glucose, Bld: 104 mg/dL — ABNORMAL HIGH (ref 70–99)
Potassium: 3.8 mEq/L (ref 3.5–5.1)
Sodium: 138 mEq/L (ref 135–145)

## 2011-06-23 ENCOUNTER — Ambulatory Visit (HOSPITAL_BASED_OUTPATIENT_CLINIC_OR_DEPARTMENT_OTHER): Payer: 59 | Admitting: Anesthesiology

## 2011-06-23 ENCOUNTER — Encounter (HOSPITAL_BASED_OUTPATIENT_CLINIC_OR_DEPARTMENT_OTHER)
Admission: RE | Disposition: A | Discharge status: Home / Self Care | Payer: Self-pay | Source: Ambulatory Visit | Attending: Orthopedic Surgery

## 2011-06-23 ENCOUNTER — Encounter (HOSPITAL_BASED_OUTPATIENT_CLINIC_OR_DEPARTMENT_OTHER): Payer: Self-pay | Admitting: Anesthesiology

## 2011-06-23 ENCOUNTER — Ambulatory Visit (HOSPITAL_BASED_OUTPATIENT_CLINIC_OR_DEPARTMENT_OTHER)
Admission: RE | Admit: 2011-06-23 | Discharge: 2011-06-23 | Disposition: A | Discharge status: Home / Self Care | Payer: 59 | Source: Ambulatory Visit | Attending: Orthopedic Surgery | Admitting: Orthopedic Surgery

## 2011-06-23 ENCOUNTER — Encounter (HOSPITAL_BASED_OUTPATIENT_CLINIC_OR_DEPARTMENT_OTHER): Payer: Self-pay | Admitting: Orthopedic Surgery

## 2011-06-23 DIAGNOSIS — F3289 Other specified depressive episodes: Secondary | ICD-10-CM | POA: Insufficient documentation

## 2011-06-23 DIAGNOSIS — I10 Essential (primary) hypertension: Secondary | ICD-10-CM | POA: Insufficient documentation

## 2011-06-23 DIAGNOSIS — F329 Major depressive disorder, single episode, unspecified: Secondary | ICD-10-CM | POA: Insufficient documentation

## 2011-06-23 DIAGNOSIS — D481 Neoplasm of uncertain behavior of connective and other soft tissue: Secondary | ICD-10-CM | POA: Insufficient documentation

## 2011-06-23 DIAGNOSIS — F411 Generalized anxiety disorder: Secondary | ICD-10-CM | POA: Insufficient documentation

## 2011-06-23 DIAGNOSIS — F17200 Nicotine dependence, unspecified, uncomplicated: Secondary | ICD-10-CM | POA: Insufficient documentation

## 2011-06-23 DIAGNOSIS — D4819 Other specified neoplasm of uncertain behavior of connective and other soft tissue: Secondary | ICD-10-CM | POA: Insufficient documentation

## 2011-06-23 HISTORY — DX: Essential (primary) hypertension: I10

## 2011-06-23 HISTORY — PX: MASS EXCISION: SHX2000

## 2011-06-23 HISTORY — DX: Depression, unspecified: F32.A

## 2011-06-23 HISTORY — DX: Major depressive disorder, single episode, unspecified: F32.9

## 2011-06-23 HISTORY — DX: Anxiety disorder, unspecified: F41.9

## 2011-06-23 SURGERY — EXCISION MASS
Anesthesia: Regional | Site: Finger | Laterality: Left | Wound class: Clean

## 2011-06-23 MED ORDER — MIDAZOLAM HCL 5 MG/5ML IJ SOLN
INTRAMUSCULAR | Status: DC | PRN
  Administered 2011-06-23: 2 mg via INTRAVENOUS

## 2011-06-23 MED ORDER — LIDOCAINE HCL (CARDIAC) 20 MG/ML IV SOLN
INTRAVENOUS | Status: DC | PRN
  Administered 2011-06-23: 75 mg via INTRAVENOUS

## 2011-06-23 MED ORDER — HYDROCODONE-ACETAMINOPHEN 5-500 MG PO TABS: 1.0000 | ORAL_TABLET | ORAL | Status: AC | PRN

## 2011-06-23 MED ORDER — LIDOCAINE HCL (PF) 0.5 % IJ SOLN
INTRAMUSCULAR | Status: DC | PRN
  Administered 2011-06-23: 30 mL via INTRATHECAL

## 2011-06-23 MED ORDER — METOCLOPRAMIDE HCL 5 MG/ML IJ SOLN: 10.0000 mg | Freq: Once | INTRAMUSCULAR | Status: DC | PRN

## 2011-06-23 MED ORDER — FENTANYL CITRATE 0.05 MG/ML IJ SOLN
INTRAMUSCULAR | Status: DC | PRN
  Administered 2011-06-23: 100 ug via INTRAVENOUS

## 2011-06-23 MED ORDER — PROPOFOL 10 MG/ML IV EMUL
INTRAVENOUS | Status: DC | PRN
  Administered 2011-06-23: 75 ug/kg/min via INTRAVENOUS

## 2011-06-23 MED ORDER — CHLORHEXIDINE GLUCONATE 4 % EX LIQD: 60.0000 mL | Freq: Once | CUTANEOUS | Status: DC

## 2011-06-23 MED ORDER — MORPHINE SULFATE 4 MG/ML IJ SOLN: 0.0500 mg/kg | INTRAMUSCULAR | Status: DC | PRN

## 2011-06-23 MED ORDER — FENTANYL CITRATE 0.05 MG/ML IJ SOLN
25.0000 ug | INTRAMUSCULAR | Status: DC | PRN
  Administered 2011-06-23: 50 ug via INTRAVENOUS
  Administered 2011-06-23: 25 ug via INTRAVENOUS

## 2011-06-23 MED ORDER — BUPIVACAINE HCL (PF) 0.25 % IJ SOLN
INTRAMUSCULAR | Status: DC | PRN
  Administered 2011-06-23: 7 mL

## 2011-06-23 MED ORDER — ONDANSETRON HCL 4 MG/2ML IJ SOLN
INTRAMUSCULAR | Status: DC | PRN
  Administered 2011-06-23: 4 mg via INTRAVENOUS

## 2011-06-23 MED ORDER — MIDAZOLAM HCL 2 MG/2ML IJ SOLN: 0.5000 mg | INTRAMUSCULAR | Status: DC | PRN

## 2011-06-23 MED ORDER — LACTATED RINGERS IV SOLN
INTRAVENOUS | Status: DC
  Administered 2011-06-23 (×2): via INTRAVENOUS

## 2011-06-23 MED ORDER — CEFAZOLIN SODIUM 1-5 GM-% IV SOLN
1.0000 g | INTRAVENOUS | Status: AC
  Administered 2011-06-23: 2 g via INTRAVENOUS

## 2011-06-23 MED ORDER — FENTANYL CITRATE 0.05 MG/ML IJ SOLN: 50.0000 ug | INTRAMUSCULAR | Status: DC | PRN

## 2011-06-23 SURGICAL SUPPLY — 51 items
BANDAGE COBAN STERILE 2 (GAUZE/BANDAGES/DRESSINGS) IMPLANT
BANDAGE GAUZE ELAST BULKY 4 IN (GAUZE/BANDAGES/DRESSINGS) IMPLANT
BLADE MINI RND TIP GREEN BEAV (BLADE) IMPLANT
BLADE SURG 15 STRL LF DISP TIS (BLADE) ×1 IMPLANT
BLADE SURG 15 STRL SS (BLADE) ×1
BNDG COHESIVE 1X5 TAN STRL LF (GAUZE/BANDAGES/DRESSINGS) IMPLANT
BNDG COHESIVE 3X5 TAN STRL LF (GAUZE/BANDAGES/DRESSINGS) IMPLANT
BNDG ESMARK 4X9 LF (GAUZE/BANDAGES/DRESSINGS) IMPLANT
CHLORAPREP W/TINT 26ML (MISCELLANEOUS) ×2 IMPLANT
CLOTH BEACON ORANGE TIMEOUT ST (SAFETY) ×2 IMPLANT
CORDS BIPOLAR (ELECTRODE) ×2 IMPLANT
COVER MAYO STAND STRL (DRAPES) ×2 IMPLANT
COVER TABLE BACK 60X90 (DRAPES) ×2 IMPLANT
CUFF TOURNIQUET SINGLE 18IN (TOURNIQUET CUFF) ×2 IMPLANT
DECANTER SPIKE VIAL GLASS SM (MISCELLANEOUS) IMPLANT
DRAIN PENROSE 1/2X12 LTX STRL (WOUND CARE) IMPLANT
DRAPE EXTREMITY T 121X128X90 (DRAPE) ×2 IMPLANT
DRAPE SURG 17X23 STRL (DRAPES) ×2 IMPLANT
GAUZE XEROFORM 1X8 LF (GAUZE/BANDAGES/DRESSINGS) ×2 IMPLANT
GLOVE BIO SURGEON STRL SZ 6.5 (GLOVE) IMPLANT
GLOVE BIOGEL PI IND STRL 8 (GLOVE) ×1 IMPLANT
GLOVE BIOGEL PI INDICATOR 8 (GLOVE) ×1
GLOVE SURG ORTHO 8.0 STRL STRW (GLOVE) ×2 IMPLANT
GLOVE SURG SS PI 8.0 STRL IVOR (GLOVE) ×2 IMPLANT
GOWN BRE IMP PREV XXLGXLNG (GOWN DISPOSABLE) ×4 IMPLANT
GOWN PREVENTION PLUS XLARGE (GOWN DISPOSABLE) IMPLANT
NDL SAFETY ECLIPSE 18X1.5 (NEEDLE) IMPLANT
NEEDLE 27GAX1X1/2 (NEEDLE) ×2 IMPLANT
NEEDLE HYPO 18GX1.5 SHARP (NEEDLE)
NS IRRIG 1000ML POUR BTL (IV SOLUTION) ×2 IMPLANT
PACK BASIN DAY SURGERY FS (CUSTOM PROCEDURE TRAY) ×2 IMPLANT
PAD CAST 3X4 CTTN HI CHSV (CAST SUPPLIES) IMPLANT
PADDING CAST ABS 3INX4YD NS (CAST SUPPLIES)
PADDING CAST ABS 4INX4YD NS (CAST SUPPLIES)
PADDING CAST ABS COTTON 3X4 (CAST SUPPLIES) IMPLANT
PADDING CAST ABS COTTON 4X4 ST (CAST SUPPLIES) IMPLANT
PADDING CAST COTTON 3X4 STRL (CAST SUPPLIES)
SPLINT FINGER 5/8X2.25 (CAST SUPPLIES) ×2 IMPLANT
SPLINT PLASTALUME 2 1/4 (CAST SUPPLIES) ×4
SPLINT PLASTER CAST XFAST 3X15 (CAST SUPPLIES) IMPLANT
SPLINT PLASTER XTRA FASTSET 3X (CAST SUPPLIES)
SPONGE GAUZE 4X4 12PLY (GAUZE/BANDAGES/DRESSINGS) ×2 IMPLANT
STOCKINETTE 4X48 STRL (DRAPES) ×2 IMPLANT
SUT VIC AB 4-0 P2 18 (SUTURE) IMPLANT
SUT VICRYL RAPID 5 0 P 3 (SUTURE) IMPLANT
SUT VICRYL RAPIDE 4/0 PS 2 (SUTURE) ×2 IMPLANT
SYR BULB 3OZ (MISCELLANEOUS) ×2 IMPLANT
SYR CONTROL 10ML LL (SYRINGE) ×2 IMPLANT
TOWEL OR 17X24 6PK STRL BLUE (TOWEL DISPOSABLE) ×2 IMPLANT
UNDERPAD 30X30 INCONTINENT (UNDERPADS AND DIAPERS) ×2 IMPLANT
WATER STERILE IRR 1000ML POUR (IV SOLUTION) IMPLANT

## 2011-06-23 NOTE — Anesthesia Postprocedure Evaluation (Signed)
Anesthesia Post Note  Patient: Adrienne Savage  Procedure(s) Performed: Procedure(s) (LRB): EXCISION MASS (Left)  Anesthesia type: MAC  Patient location: PACU  Post pain: Pain level controlled  Post assessment: Patient's Cardiovascular Status Stable  Last Vitals:  Filed Vitals:   06/23/11 1513  BP: 140/91  Pulse: 72  Temp: 36.8 C  Resp: 16    Post vital signs: Reviewed and stable  Level of consciousness: alert  Complications: No apparent anesthesia complications

## 2011-06-23 NOTE — Anesthesia Preprocedure Evaluation (Signed)
Anesthesia Evaluation  Patient identified by MRN, date of birth, ID band Patient awake    Reviewed: Allergy & Precautions, H&P , NPO status , Patient's Chart, lab work & pertinent test results, reviewed documented beta blocker date and time   Airway Mallampati: II TM Distance: >3 FB Neck ROM: full    Dental   Pulmonary Current Smoker,          Cardiovascular hypertension, On Medications     Neuro/Psych negative neurological ROS  negative psych ROS   GI/Hepatic negative GI ROS, Neg liver ROS,   Endo/Other  negative endocrine ROS  Renal/GU negative Renal ROS  negative genitourinary   Musculoskeletal   Abdominal   Peds  Hematology negative hematology ROS (+)   Anesthesia Other Findings See surgeon's H&P   Reproductive/Obstetrics negative OB ROS                           Anesthesia Physical Anesthesia Plan  ASA: II  Anesthesia Plan: MAC and Bier Block   Post-op Pain Management:    Induction: Intravenous  Airway Management Planned: Simple Face Mask  Additional Equipment:   Intra-op Plan:   Post-operative Plan:   Informed Consent: I have reviewed the patients History and Physical, chart, labs and discussed the procedure including the risks, benefits and alternatives for the proposed anesthesia with the patient or authorized representative who has indicated his/her understanding and acceptance.     Plan Discussed with: CRNA and Surgeon  Anesthesia Plan Comments:         Anesthesia Quick Evaluation

## 2011-06-23 NOTE — Op Note (Signed)
Dictated number: [id number]

## 2011-06-23 NOTE — Brief Op Note (Signed)
06/23/2011  2:19 PM  PATIENT:  Adrienne Savage  41 y.o. female  PRE-OPERATIVE DIAGNOSIS:  mass interphalangeal left thumb, mass distal phalanx left index finger  POST-OPERATIVE DIAGNOSIS:  mass interphalangeal left thumb, mass distal phalanx left index finger  PROCEDURE:  Procedure(s) (LRB): EXCISION MASS (Left)  SURGEON:  Surgeon(s) and Role:    * Nicki Reaper, MD - Primary  PHYSICIAN ASSISTANT:   ASSISTANTS: none   ANESTHESIA:   local and regional  EBL:  Total I/O In: 1000 [I.V.:1000] Out: -   BLOOD ADMINISTERED:none  DRAINS: none   LOCAL MEDICATIONS USED:  MARCAINE     SPECIMEN:  Excision  DISPOSITION OF SPECIMEN:  PATHOLOGY  COUNTS:  YES  TOURNIQUET:   Total Tourniquet Time Documented: Forearm (Left) - 28 minutes  DICTATION: .Other Dictation: Dictation Number [id number]  PLAN OF CARE: Discharge to home after PACU  PATIENT DISPOSITION:  PACU - hemodynamically stable.

## 2011-06-23 NOTE — Discharge Instructions (Addendum)
[facility] Instructions Hand Surgery  Wound Care: Keep your hand elevated above the level of your heart.  Do not allow it to dangle  by your side.  Keep the dressing dry and do not remove it unless your doctor advises you to do so.  He will usually change it at the time of your post-op visit.  Moving your fingers is advised to stimulate circulation but will depend on the site of your surgery.  If you have a splint applied, your doctor will advise you regarding movement.  Activity: Do not drive or operate machinery today.  Rest today and then you may return to your normal activity and work as indicated by your physician.  Diet:  Drink liquids today or eat a light diet.  You may resume a regular diet tomorrow.    General expectations: Pain for two to three days. Fingers may become slightly swollen.  Call your doctor if any of the following occur: Severe pain not relieved by pain medication. Elevated temperature. Dressing soaked with blood. Inability to move fingers. White or bluish color to fingers.[facility]  [address] [place],  [address] [phone]   Post Anesthesia Home Care Instructions  Activity: Get plenty of rest for the remainder of the day. A responsible adult should stay with you for 24 hours following the procedure.  For the next 24 hours, DO NOT: -Drive a car -Operate machinery -Drink alcoholic beverages -Take any medication unless instructed by your physician -Make any legal decisions or sign important papers.  Meals: Start with liquid foods such as gelatin or soup. Progress to regular foods as tolerated. Avoid greasy, spicy, heavy foods. If nausea and/or vomiting occur, drink only clear liquids until the nausea and/or vomiting subsides. Call your physician if vomiting continues.  Special Instructions/Symptoms: Your throat may feel dry or sore from the anesthesia or the breathing tube placed in your throat during surgery. If  this causes discomfort, gargle with warm salt water. The discomfort should disappear within 24 hours.   

## 2011-06-23 NOTE — Op Note (Signed)
Adrienne Savage, Adrienne Savage                 ACCOUNT NO.:  [id number]  MEDICAL RECORD NO.:  [id number]  LOCATION:                                 FACILITY:  PHYSICIAN:  Cindee Salt, M.D.       DATE OF BIRTH:  06-07-1970  DATE OF PROCEDURE:  06/23/2011 DATE OF DISCHARGE:                              OPERATIVE REPORT   PREOPERATIVE DIAGNOSIS:  Mass, left thumb; mass, left index finger.  POSTOPERATIVE DIAGNOSIS:  Mass, left thumb; mass, left index finger.  OPERATION:  Excisional biopsy of mass left thumb, left index finger.  SURGEON:  Cindee Salt, M.D.  ANESTHESIA:  Forearm-based IV regional.  ANESTHESIOLOGIST:  Zenon Mayo, M.D.  HISTORY:  The patient is a 41 year old female with a history of a mass over the proximal phalanx, radial aspect, mid-lateral volar of her left thumb.  A mass over the distal phalanx of her index finger, ulnar aspect of volar-ulnar side.  She is desirous having these removed.  She is aware of risks and complications including infection; recurrence; injury to arteries, nerves, tendons; incomplete relief of symptoms; dystrophy. In preoperative area, the patient is seen, the extremity marked by both the patient and surgeon.  Antibiotic given.  PROCEDURE:  The patient was brought to the operating room where a forearm-based IV regional anesthetic was carried out without difficulty. She was prepped using ChloraPrep, supine position, left arm free.  A 3- minute dry time was allowed.  Time-out taken, confirming the patient and procedure.  The thumb was attended first.  A midlateral incision was made, carried down through subcutaneous tissue.  Bleeders were electrocauterized with bipolar.  Neurovascular structures were identified.  A multilobulated, tanned with a light-gray tumor was immediately encountered.  With blunt and sharp dissection, this was dissected free.  Appeared to come from the flexor sheath.  This was removed in total and sent to Pathology.  No  further lesions were identified.  The wound was irrigated.  The skin was closed with interrupted 5-0 Vicryl Rapide sutures.  A separate incision was then made on the ulnar aspect of the mid-lateral line of the index finger, carried down through subcutaneous tissue.  Again neurovascular structures were identified and protected.  A similar mass was encountered on her middle finger, this was gray without tannish areas. Again this was bluntly and sharply dissected protecting neurovascular structures.  This also appeared to arise from the flexor sheath.  This was sent to Pathology.  Each was sent separately.  The wound was again irrigated and skin closed with interrupted 5-0 Vicryl Rapide sutures.  A metacarpal block was given to each of the digits, 8 mL was used.  A sterile compressive dressing and splint to each digit was applied.  On deflation of the tourniquet, the remaining fingers pinked.  She was taken to the recovery room for observation in satisfactory condition.          ______________________________ Cindee Salt, M.D.     GK/MEDQ  D:  06/23/2011  T:  06/23/2011  Job:  [id number]

## 2011-06-23 NOTE — Transfer of Care (Signed)
Immediate Anesthesia Transfer of Care Note  Patient: Adrienne Savage  Procedure(s) Performed: Procedure(s) (LRB): EXCISION MASS (Left)  Patient Location: PACU  Anesthesia Type: MAC and Bier block  Level of Consciousness: awake, alert  and oriented  Airway & Oxygen Therapy: Patient Spontanous Breathing and Patient connected to face mask oxygen  Post-op Assessment: Report given to PACU RN and Post -op Vital signs reviewed and stable  Post vital signs: Reviewed and stable  Complications: No apparent anesthesia complications

## 2011-06-23 NOTE — H&P (Signed)
Adrienne Savage is a 41 year old right hand dominant former patient who comes in referred by Dr. Clelia Croft for a consultation with respect to a ganglion cyst in the dorsal aspect of her right wrist. This has been there for approximately one year and has increased/decreased in size. She has only occasional discomfort. She also has a mass on the volar radial aspect IP joint left thumb. This causes her pain when she hits the area. She complains of intermittent mild throbbing and burning type pain. She states it is gradually getting worse. It wakes her at night. She also has a mass over the left index finger ulnar aspect of the DIP joint distal phalanx area. This has been present for several years. No history of diabetes, thyroid problems, arthritis or gout.   Past Medical History: She is allergic to "cyclins". She is on Exforge and HCTZ, and Celexa. She has had C-section 2008.  Family Medical History: Positive for heart disease and high BP   Social History: She smokes 1 PPD and is advised to quit. She drinks socially. She is married. She is a Research scientist (life sciences).  Review of Systems: Positive for glasses and high BP, otherwise negative for 14 points. Adrienne Savage is an 41 y.o. female.   Chief Complaint: mass lif and tl thumb HPI: see abocve  Past Medical History  Diagnosis Date  . Hypertension   . Depression   . Anxiety     Past Surgical History  Procedure Date  . Cesarean section 2008  . Multiple tooth extractions 2/12  . Cystectomy     cyst on lip    History reviewed. No pertinent family history. Social History:  reports that she has been smoking.  She does not have any smokeless tobacco history on file. She reports that she drinks alcohol. She reports that she does not use illicit drugs.  Allergies:  Allergies  Allergen Reactions  . Cyclinex (Tetracycline Hcl) Shortness Of Breath  . Doxycycline Shortness Of Breath  . Minocycline Shortness Of Breath  . Tetracyclines & Related  Shortness Of Breath    No current facility-administered medications on file as of 06/23/2011.   Medications Prior to Admission  Medication Sig Dispense Refill  . citalopram (CELEXA) 20 MG tablet Take 20 mg by mouth daily.        No results found for this or any previous visit (from the past 48 hour(s)).  No results found.   Pertinent items are noted in HPI.  Height 5\' 4"  (1.626 m), weight 90.719 kg (200 lb), last menstrual period 06/23/2011.  General appearance: alert, cooperative and appears stated age Head: Normocephalic, without obvious abnormality Neck: no adenopathy Resp: clear to auscultation bilaterally Cardio: regular rate and rhythm, S1, S2 normal, no murmur, click, rub or gallop GI: soft, non-tender; bowel sounds normal; no masses,  no organomegaly Extremities: extremities normal, atraumatic, no cyanosis or edema Pulses: 2+ and symmetric Skin: Skin color, texture, turgor normal. No rashes or lesions Neurologic: Grossly normal Incision/Wound: na  Assessment/Plan She would like to have the masses on her thumb and index finger removed. We have discussed the possibility of excision. The pre, peri and post op course are discussed along with risks and complications.  There is no guarantee with surgery, possibility of infection, recurrence, injury to arteries, nerves and tendons, incomplete relief of symptoms, possibility of numbness and tingling, possibility of nerve tumor and dystrophy. She would like to have these excised. She is aware of recurrence. Questions were invited and answered  in detail. She is scheduled for excision mass left thumb, left index finger.  Glyn Gerads R 06/23/2011, 12:16 PM

## 2011-06-24 ENCOUNTER — Encounter (HOSPITAL_BASED_OUTPATIENT_CLINIC_OR_DEPARTMENT_OTHER): Payer: Self-pay | Admitting: Orthopedic Surgery

## 2011-08-20 ENCOUNTER — Ambulatory Visit (INDEPENDENT_AMBULATORY_CARE_PROVIDER_SITE_OTHER): Payer: 59 | Admitting: General Surgery

## 2011-08-26 ENCOUNTER — Ambulatory Visit (INDEPENDENT_AMBULATORY_CARE_PROVIDER_SITE_OTHER): Payer: Commercial Managed Care - PPO | Admitting: General Surgery

## 2011-08-26 ENCOUNTER — Encounter (INDEPENDENT_AMBULATORY_CARE_PROVIDER_SITE_OTHER): Payer: Self-pay | Admitting: General Surgery

## 2011-08-26 ENCOUNTER — Other Ambulatory Visit (INDEPENDENT_AMBULATORY_CARE_PROVIDER_SITE_OTHER): Payer: Self-pay | Admitting: General Surgery

## 2011-08-26 DIAGNOSIS — E669 Obesity, unspecified: Secondary | ICD-10-CM | POA: Insufficient documentation

## 2011-08-26 DIAGNOSIS — Z1231 Encounter for screening mammogram for malignant neoplasm of breast: Secondary | ICD-10-CM

## 2011-08-26 NOTE — Progress Notes (Signed)
Patient ID: Adrienne Savage, female   DOB: [birth year], 41 y.o.   MRN: [id number]  Chief Complaint  Patient presents with  . New Evaluation    Eval for Lap Band    HPI Adrienne Savage is a 41 y.o. female.   HPI The patient is a 41 year old obese African American female referred by Dr. Clelia Croft for evaluation for weight loss surgery. The patient is specifically interested in laparoscopic adjustable gastric band surgery. She likes the fact that it is reversible, minimally invasive, and there is no rerouting of abdominal organs. She states that she has struggled with her weight for most of her adult life. Despite numerous attempts for sustained weight loss she has been unsuccessful. She has tried the [employer], [employer], bariatric clinic, Xenical, phentermine, and hCG injections. She was most successful with hCG injections for which she lost 25 pounds. She subsequently regained that weight back plus additional weight. She is currently not exercising. Past Medical History  Diagnosis Date  . Hypertension   . Depression   . Anxiety     Past Surgical History  Procedure Date  . Cesarean section 2008  . Multiple tooth extractions 2/12  . Cystectomy     cyst on lip  . Mass excision 06/23/2011    Procedure: EXCISION MASS;  Surgeon: Nicki Reaper, MD;  Location: [facility];  Service: Orthopedics;  Laterality: Left;  left thumb and left index finger    Family History  Problem Relation Age of Onset  . Heart disease Father     Social History History  Substance Use Topics  . Smoking status: Current Everyday Smoker -- 1.0 packs/day  . Smokeless tobacco: Never Used  . Alcohol Use: Yes     daily wine    Allergies  Allergen Reactions  . Cyclinex (Tetracycline Hcl) Shortness Of Breath  . Doxycycline Shortness Of Breath  . Minocycline Shortness Of Breath  . Tetracyclines & Related Shortness Of Breath  . Hydrocortisone Rash    Current Outpatient Prescriptions  Medication Sig  Dispense Refill  . ALPRAZolam (XANAX) 0.5 MG tablet Take 0.5 mg by mouth at bedtime as needed.      Marland Kitchen amLODipine-valsartan (EXFORGE) 10-160 MG per tablet Take 1 tablet by mouth daily.      . citalopram (CELEXA) 20 MG tablet Take 20 mg by mouth daily.      Marland Kitchen zolpidem (AMBIEN) 5 MG tablet Take 5 mg by mouth at bedtime as needed.        Review of Systems Review of Systems  Constitutional: Negative for fever, activity change, appetite change and unexpected weight change.  HENT: Negative for hearing loss, nosebleeds, neck pain and neck stiffness.   Eyes: Negative for photophobia and visual disturbance.  Respiratory: Negative for chest tightness, shortness of breath and wheezing.        DOE with 1 flight; +snoring, sometimes stops breathing per husband. Some sleepiness during daytime. Some leg swelling intermittently.   Cardiovascular: Negative for chest pain and palpitations.  Gastrointestinal: Negative for nausea, vomiting, abdominal pain and blood in stool.       Irreg BMs. Sometimes 2 bm/week. Some heartburn takes OTC reflux med;   Genitourinary: Negative for dysuria, hematuria, decreased urine volume and difficulty urinating.       G3P1; +period, monthly, normal flow.   Musculoskeletal:       Right knee pain  Skin: Negative for color change, pallor and rash.  Neurological: Negative for dizziness, tremors, seizures, speech difficulty,  weakness, numbness and headaches.  Hematological: Negative for adenopathy. Does not bruise/bleed easily.       Had glucose intolerance during pregnancy.   Psychiatric/Behavioral: Negative for hallucinations, self-injury and agitation.       Sees a psychiatrist. Drinks 2 glasses wine per day. Denies drugs.     Blood pressure 130/62, pulse 88, temperature 98 F (36.7 C), temperature source Temporal, resp. rate 14, height 5\' 4"  (1.626 m), weight 216 lb 9.6 oz (98.249 kg).  Physical Exam Physical Exam  Vitals reviewed. Constitutional: She is oriented to  person, place, and time. She appears well-developed and well-nourished. No distress.       obese  HENT:  Head: Normocephalic and atraumatic.  Right Ear: External ear normal.  Left Ear: External ear normal.  Eyes: Conjunctivae and EOM are normal. Pupils are equal, round, and reactive to light. No scleral icterus.  Neck: Normal range of motion. Neck supple. No JVD present. No tracheal deviation present. Thyromegaly: possible mild thyroid enlargement. no masses.  Cardiovascular: Normal rate, regular rhythm, normal heart sounds and intact distal pulses.   Pulmonary/Chest: Effort normal and breath sounds normal. No stridor. No respiratory distress. She has no wheezes. She has no rales.  Abdominal: Soft. She exhibits no distension. There is no tenderness. There is no rebound.    Musculoskeletal: Normal range of motion. She exhibits no edema and no tenderness.  Lymphadenopathy:    She has no cervical adenopathy.  Neurological: She is alert and oriented to person, place, and time. She exhibits normal muscle tone.  Skin: Skin is warm and dry. No rash noted. She is not diaphoretic. No erythema. No pallor.  Psychiatric: She has a normal mood and affect. Her behavior is normal. Judgment and thought content normal.    Data Reviewed Referring medical letter  Assessment    Morbid obesity  BMI 37.2 Hypertension Right Knee pain Reflux    Plan    The patient meets weight loss surgery criteria. I think she would be acceptable candidate for LAGB.  She did briefly ask about her qualifications for LRYGB - I explained that currently she is not a candidate for LRYGB because of her smoking. I encouraged smoking cessation. I explained that if she stopped smoking then she would qualify at that point.   We discussed laparoscopic adjustable gastric banding. The patient was given Agricultural engineer. We discussed the risk and benefits of surgery including but not limited to bleeding, infection, injury to  surrounding structures, blood clot formation such as deep venous thrombosis or pulmonary embolism, need to convert to an open procedure, band slippage, band erosion, failure to loose weight, port complications (leak or flippage), potential need for reoperative surgery, esophageal dilatation, worsening reflux, and vitamin deficiencies. We discussed the typical post operative recovery course. We discussed that their postoperative diet will be modified for several weeks. We specifically talked about the need to be on a liquid diet for one to 2 weeks after surgery. We also discussed the typical postoperative course with a laparoscopic adjustable gastric band and the need for frequent postoperative visits to assess the volume status of the band.  We discussed the typical expected weight loss with a laparoscopic adjustable gastric band. I explained to the patient that they can expect to lose 40-60% of their excess body weight if they are compliant with their postoperative instructions. I emphasized that diet and exercise were an essential part to helping her lose weight. However I did explain that some patients loose less than 40%  and some patients lose more than 60% of their excess body weight.  I explained that the likelihood of improvement in their obesity is good.   We will start our evaluation process which was described in detail to the patient and her husband.   Mary Sella. Andrey Campanile, MD, FACS General, Bariatric, & Minimally Invasive Surgery [facility], [place]        [facility] M 08/26/2011, 12:05 PM

## 2011-08-26 NOTE — Patient Instructions (Signed)
Try to start doing some form of exercising now... Walking, swimming, water aerobics, bicycling. Can use a pedometer like we discussed

## 2011-08-27 ENCOUNTER — Other Ambulatory Visit (INDEPENDENT_AMBULATORY_CARE_PROVIDER_SITE_OTHER): Payer: Self-pay | Admitting: General Surgery

## 2011-09-22 ENCOUNTER — Ambulatory Visit (HOSPITAL_COMMUNITY): Payer: 59

## 2011-09-22 ENCOUNTER — Ambulatory Visit (HOSPITAL_COMMUNITY): Admission: RE | Admit: 2011-09-22 | Payer: 59 | Source: Ambulatory Visit

## 2011-09-22 ENCOUNTER — Ambulatory Visit (HOSPITAL_BASED_OUTPATIENT_CLINIC_OR_DEPARTMENT_OTHER): Payer: 59 | Attending: General Surgery | Admitting: General Practice

## 2011-09-22 VITALS — Ht 64.0 in | Wt 215.0 lb

## 2011-09-22 DIAGNOSIS — G4730 Sleep apnea, unspecified: Secondary | ICD-10-CM | POA: Insufficient documentation

## 2011-09-22 DIAGNOSIS — G4733 Obstructive sleep apnea (adult) (pediatric): Secondary | ICD-10-CM

## 2011-09-22 DIAGNOSIS — R0989 Other specified symptoms and signs involving the circulatory and respiratory systems: Secondary | ICD-10-CM | POA: Insufficient documentation

## 2011-09-22 DIAGNOSIS — R0609 Other forms of dyspnea: Secondary | ICD-10-CM | POA: Insufficient documentation

## 2011-09-22 DIAGNOSIS — G4710 Hypersomnia, unspecified: Secondary | ICD-10-CM | POA: Insufficient documentation

## 2011-09-27 DIAGNOSIS — R0989 Other specified symptoms and signs involving the circulatory and respiratory systems: Secondary | ICD-10-CM

## 2011-09-27 DIAGNOSIS — G4710 Hypersomnia, unspecified: Secondary | ICD-10-CM

## 2011-09-27 DIAGNOSIS — G4730 Sleep apnea, unspecified: Secondary | ICD-10-CM

## 2011-09-27 DIAGNOSIS — R0609 Other forms of dyspnea: Secondary | ICD-10-CM

## 2011-09-27 NOTE — Procedures (Signed)
To NAME:  Adrienne Savage, HIPPERT                 ACCOUNT NO.:  [id number]  MEDICAL RECORD NO.:  [id number]          PATIENT TYPE:  OUT  LOCATION:  SLEEP CENTER                 FACILITY:  [facility]  PHYSICIAN:  Athaliah Baumbach D. Maple Hudson, MD, FCCP, FACPDATE OF BIRTH:  04/16/1969  DATE OF STUDY:  09/22/2011                           NOCTURNAL POLYSOMNOGRAM  REFERRING PHYSICIAN:  Mary Sella. Andrey Campanile, MD, FACS  INDICATION FOR STUDY:  Hypersomnia with sleep apnea.  EPWORTH SLEEPINESS SCORE:  5/24.  BMI 36.9.  Weight 215 pounds, height 64 inches, neck 15 inches.  MEDICATIONS:  Home medications are charted and reviewed.  SLEEP ARCHITECTURE:  Total sleep time 317.5 minutes with sleep efficiency 83.6%.  Stage I was 9.8%, stage II 59.1%, stage III absent, REM 31.2% of total sleep time.  Sleep latency 5.5 minutes, REM latency 101 minutes, awake after sleep onset 57 minutes, arousal index 7.2.  BEDTIME MEDICATION:  None.  RESPIRATORY DATA:  Apnea-hypopnea index (AHI) 0.4 per hour.  A total of 2 events was scored, both as obstructive apneas in non-supine sleep position.  REM AHI 1.2 per hour.  There were insufficient numbers of events to meet protocol requirements for initiation of split protocol, CPAP titration on the study.  OXYGEN DATA:  Moderate snoring with oxygen desaturation to a nadir of 86% and mean oxygen saturation through the study of 95.1% on room air.  CARDIAC DATA:  Normal sinus rhythm.  MOVEMENT-PARASOMNIA:  No significant movement disturbance.  Bathroom x1.  IMPRESSIONS-RECOMMENDATIONS:  Occasional respiratory event with sleep disturbance, within normal limits.  AHI 0.4 per hour (the normal range for adults is from 0-5 events per hour).  Moderate snoring with oxygen desaturation to a nadir of 86% and mean oxygen saturation through the study of 95.1% on room air.  Continuous positive airway pressure titration protocol was not indicated.    [name] D. Maple Hudson, MD, [facility], FACP Diplomate, American  Board of Sleep Medicine   CDY/MEDQ  D:  09/27/2011 09:28:18  T:  09/27/2011 12:08:37  Job:  [id number]

## 2011-10-01 ENCOUNTER — Encounter: Payer: Self-pay | Admitting: *Deleted

## 2011-10-01 ENCOUNTER — Encounter: Payer: 59 | Attending: General Surgery | Admitting: *Deleted

## 2011-10-01 VITALS — Ht 64.0 in | Wt 221.0 lb

## 2011-10-01 DIAGNOSIS — E669 Obesity, unspecified: Secondary | ICD-10-CM

## 2011-10-01 DIAGNOSIS — Z713 Dietary counseling and surveillance: Secondary | ICD-10-CM | POA: Insufficient documentation

## 2011-10-01 DIAGNOSIS — Z01818 Encounter for other preprocedural examination: Secondary | ICD-10-CM | POA: Insufficient documentation

## 2011-10-01 NOTE — Progress Notes (Signed)
  Pre-Op Assessment Visit:  Pre-Operative LAGB Surgery  Medical Nutrition Therapy:  Appt start time: 0945   End time:  1045.  Patient was seen on 10/01/2011 for Pre-Operative LAGB  Nutrition Assessment. Assessment and letter of approval faxed to [facility] coordinator on 10/01/2011.  Approval letter sent to [facility] and will be available in the chart under the media tab.  TANITA  BODY COMP RESULTS  10/01/11   %Fat 41.3%   Fat Mass (lbs) 91.5   Fat Free Mass (lbs) 129.5   Total Body Water (lbs) 95.0   Handouts given during visit include:  Pre-Op Goals   Bariatric Surgery Protein Shakes handout  Patient to call for Pre-Op and Post-Op Nutrition Education at the [facility] when surgery is scheduled.

## 2011-10-01 NOTE — Patient Instructions (Addendum)
   Follow Pre-Op Nutrition Goals to prepare for Lapband or Gastric Bypass Surgery.   Call the [facility] at [phone] once you have been given your surgery date to enrolled in the Pre-Op Nutrition Class. You will need to attend this nutrition class 3-4 weeks prior to your surgery.  

## 2011-10-04 ENCOUNTER — Encounter (HOSPITAL_COMMUNITY): Payer: Self-pay | Admitting: *Deleted

## 2011-10-04 ENCOUNTER — Emergency Department (HOSPITAL_COMMUNITY)
Admission: EM | Admit: 2011-10-04 | Discharge: 2011-10-05 | Disposition: A | Discharge status: Home / Self Care | Payer: 59 | Attending: Emergency Medicine | Admitting: Emergency Medicine

## 2011-10-04 DIAGNOSIS — I10 Essential (primary) hypertension: Secondary | ICD-10-CM | POA: Insufficient documentation

## 2011-10-04 DIAGNOSIS — R55 Syncope and collapse: Secondary | ICD-10-CM | POA: Insufficient documentation

## 2011-10-04 DIAGNOSIS — F17200 Nicotine dependence, unspecified, uncomplicated: Secondary | ICD-10-CM | POA: Insufficient documentation

## 2011-10-04 DIAGNOSIS — E860 Dehydration: Secondary | ICD-10-CM | POA: Insufficient documentation

## 2011-10-04 DIAGNOSIS — Z79899 Other long term (current) drug therapy: Secondary | ICD-10-CM | POA: Insufficient documentation

## 2011-10-04 DIAGNOSIS — F341 Dysthymic disorder: Secondary | ICD-10-CM | POA: Insufficient documentation

## 2011-10-04 LAB — CBC WITH DIFFERENTIAL/PLATELET
Basophils Absolute: 0 10*3/uL (ref 0.0–0.1)
Basophils Relative: 0 % (ref 0–1)
Eosinophils Absolute: 0.2 10*3/uL (ref 0.0–0.7)
Eosinophils Relative: 2 % (ref 0–5)
HCT: 38.5 % (ref 36.0–46.0)
Hemoglobin: 13.2 g/dL (ref 12.0–15.0)
Lymphocytes Relative: 20 % (ref 12–46)
Lymphs Abs: 1.8 10*3/uL (ref 0.7–4.0)
MCH: 30.1 pg (ref 26.0–34.0)
MCHC: 34.3 g/dL (ref 30.0–36.0)
MCV: 87.9 fL (ref 78.0–100.0)
Monocytes Absolute: 0.7 10*3/uL (ref 0.1–1.0)
Monocytes Relative: 7 % (ref 3–12)
Neutro Abs: 6.5 10*3/uL (ref 1.7–7.7)
Neutrophils Relative %: 71 % (ref 43–77)
Platelets: 267 10*3/uL (ref 150–400)
RBC: 4.38 MIL/uL (ref 3.87–5.11)
RDW: 12.4 % (ref 11.5–15.5)
WBC: 9.1 10*3/uL (ref 4.0–10.5)

## 2011-10-04 LAB — BASIC METABOLIC PANEL
BUN: 11 mg/dL (ref 6–23)
CO2: 25 mEq/L (ref 19–32)
Calcium: 9 mg/dL (ref 8.4–10.5)
Chloride: 102 mEq/L (ref 96–112)
Creatinine, Ser: 0.91 mg/dL (ref 0.50–1.10)
GFR calc Af Amer: 89 mL/min — ABNORMAL LOW (ref >90)
GFR calc non Af Amer: 77 mL/min — ABNORMAL LOW (ref >90)
Glucose, Bld: 112 mg/dL — ABNORMAL HIGH (ref 70–99)
Potassium: 3.3 mEq/L — ABNORMAL LOW (ref 3.5–5.1)
Sodium: 137 mEq/L (ref 135–145)

## 2011-10-04 LAB — URINALYSIS, ROUTINE W REFLEX MICROSCOPIC
Glucose, UA: NEGATIVE mg/dL
Hgb urine dipstick: NEGATIVE
Ketones, ur: NEGATIVE mg/dL
Leukocytes, UA: NEGATIVE
Nitrite: NEGATIVE
Protein, ur: 30 mg/dL — AB
Specific Gravity, Urine: 1.026 (ref 1.005–1.030)
Urobilinogen, UA: 1 mg/dL (ref 0.0–1.0)
pH: 5.5 (ref 5.0–8.0)

## 2011-10-04 LAB — URINE MICROSCOPIC-ADD ON

## 2011-10-04 LAB — PREGNANCY, URINE: Preg Test, Ur: NEGATIVE

## 2011-10-04 MED ORDER — POTASSIUM CHLORIDE 20 MEQ/15ML (10%) PO LIQD
40.0000 meq | Freq: Once | ORAL | Status: AC
  Administered 2011-10-05: 40 meq via ORAL
  Filled 2011-10-04: qty 30

## 2011-10-04 MED ORDER — SODIUM CHLORIDE 0.9 % IV BOLUS (SEPSIS)
1000.0000 mL | Freq: Once | INTRAVENOUS | Status: AC
  Administered 2011-10-04: 1000 mL via INTRAVENOUS

## 2011-10-04 NOTE — ED Provider Notes (Signed)
History     CSN: [id number]  Arrival date & time 10/04/11  2014   First MD Initiated Contact with Patient 10/04/11 2018      Chief Complaint  Patient presents with  . Loss of Consciousness    (Consider location/radiation/quality/duration/timing/severity/associated sxs/prior treatment) HPI  Adrienne Savage is a 41 year-old [place] American female with a history of hypertension who presents today after a syncopal episode at 7:30pm. Adrienne Savage states that she suddenly began feeling hot, diaphoretic, weak, and dizzy so she sat down. She lost consciousness and was found by a coworker. Per her coworker, Adrienne Savage was found unconscious, with her eyes closed, snoring deeply, sitting in a chair, "flaccid," with her ankles extended. The patient's coworker states that the patient was not having any tonic-clonic activity or muscle fasciculations. The patient continued in this unconscious state for approximately 8 minutes after her coworker arrived. While the patient was unconscious, she was transported onto a stretcher and EMS was called. The patient's coworker states that Adrienne Savage was confused for approximately 2 minutes after regaining consciousness. Adrienne Savage currently reports no confusion or pain. She reports mild generalized weakness and moderate fatigue. She denies any chest pain, shortness of breath, headache, nausea, or vomiting. She reports a syncopal episode 1 year ago while at a "wine festival." She states that she was told that she was dehydrated at that time and recovered well with the administration of fluids. She denies any personal or family history of seizures or sudden death.  Past Medical History  Diagnosis Date  . Hypertension   . Depression   . Anxiety   . Gestational diabetes     x2 pregnancies    Past Surgical History  Procedure Date  . Cesarean section 2008  . Multiple tooth extractions 2/12  . Cystectomy     cyst on lip  . Mass excision 06/23/2011    Procedure: EXCISION MASS;   Surgeon: Nicki Reaper, MD;  Location: [facility];  Service: Orthopedics;  Laterality: Left;  left thumb and left index finger    Family History  Problem Relation Age of Onset  . Heart disease Father     History  Substance Use Topics  . Smoking status: Current Everyday Smoker -- 0.5 packs/day    Types: Cigarettes  . Smokeless tobacco: Never Used  . Alcohol Use: 3.5 oz/week    7 drink(s) per week     daily wine    OB History    Grav Para Term Preterm Abortions TAB SAB Ect Mult Living                  Review of Systems All other systems negative except as documented in the HPI. All pertinent positives and negatives as reviewed in the HPI.  Allergies  Cyclinex; Doxycycline; Minocycline; Tetracyclines & related; and Hydrocortisone  Home Medications   Current Outpatient Rx  Name Route Sig Dispense Refill  . ALPRAZOLAM 0.5 MG PO TABS Oral Take 0.5 mg by mouth at bedtime as needed. For sleep    . AMLODIPINE-VALSARTAN-HCTZ 5-160-12.5 MG PO TABS Oral Take 1 tablet by mouth daily.    . BUPROPION HCL ER (SMOKING DET) 150 MG PO TB12 Oral Take 150 mg by mouth 2 (two) times daily.    Marland Kitchen CITALOPRAM HYDROBROMIDE 40 MG PO TABS Oral Take 40 mg by mouth daily.    Marland Kitchen ZOLPIDEM TARTRATE 5 MG PO TABS Oral Take 10 mg by mouth at bedtime as needed. For sleep  BP 111/83  Pulse 84  Temp 98.7 F (37.1 C) (Oral)  Resp 18  SpO2 100%  Physical Exam  Nursing note and vitals reviewed. Constitutional: She is oriented to person, place, and time. She appears well-developed and well-nourished. No distress.  HENT:  Head: Normocephalic and atraumatic.  Mouth/Throat: Oropharynx is clear and moist.  Eyes: EOM are normal. Pupils are equal, round, and reactive to light.  Cardiovascular: Normal rate, regular rhythm and normal heart sounds.   Pulmonary/Chest: Effort normal and breath sounds normal.  Abdominal: Soft. Bowel sounds are normal. She exhibits no distension. There is no  tenderness. There is no rebound.  Neurological: She is alert and oriented to person, place, and time. No sensory deficit. She exhibits normal muscle tone. Coordination and gait normal.  Skin: Skin is warm and dry. No rash noted.    ED Course  Procedures (including critical care time)  Labs Reviewed  BASIC METABOLIC PANEL - Abnormal; Notable for the following:    Potassium 3.3 (*)     Glucose, Bld 112 (*)     GFR calc non Af Amer 77 (*)     GFR calc Af Amer 89 (*)     All other components within normal limits  URINALYSIS, ROUTINE W REFLEX MICROSCOPIC - Abnormal; Notable for the following:    Bilirubin Urine SMALL (*)     Protein, ur 30 (*)     All other components within normal limits  URINE MICROSCOPIC-ADD ON - Abnormal; Notable for the following:    Casts HYALINE CASTS (*)     All other components within normal limits  CBC WITH DIFFERENTIAL  PREGNANCY, URINE   Patient has been recheck x4 here in the ER. Each time she continues to be stable and feels fine. The patient states that she has ambulated without difficulty or issue.The patient BP is not within her normal range but has been stable. The patient is asymptomatic with this as well. The patient is going to be ambulated with the pulse ox and then if this is normal will refer her back to her PCP and advised to skip her BP med till follow up.  MDM  MDM Reviewed: nursing note and vitals Interpretation: labs and ECG    Date: 10/05/2011  Rate: 73  Rhythm: normal sinus rhythm  QRS Axis: normal  Intervals: normal  ST/T Wave abnormalities: normal  Conduction Disutrbances:none  Narrative Interpretation:   Old EKG Reviewed: none available          Carlyle Dolly, PA-C 10/05/11 0134

## 2011-10-04 NOTE — ED Notes (Signed)
MD at bedside. 

## 2011-10-04 NOTE — ED Notes (Signed)
Pt arrived via [facility] c/o syncopal episode. Pt states she felt tired today, got hot, and diaphoretic, woke up with staff surrounding her. Per EMS Orthostatic negative, HR 70 NSR, BP 110/80, CBG 130. Pt denies pain, SOB, or illness. Allergy to hydrocortisone.

## 2011-10-05 NOTE — ED Notes (Signed)
PA at bedside.

## 2011-10-05 NOTE — ED Notes (Signed)
Pt states understanding of discharge instructions 

## 2011-10-05 NOTE — ED Notes (Signed)
Hr was 98 and o2 was 100 while ambulating

## 2011-10-09 NOTE — ED Provider Notes (Signed)
Medical screening examination/treatment/procedure(s) were performed by non-physician practitioner and as supervising physician I was immediately available for consultation/collaboration.  Wilkie Zenon, MD 10/09/11 2351 

## 2011-10-16 ENCOUNTER — Ambulatory Visit (HOSPITAL_COMMUNITY)
Admission: RE | Admit: 2011-10-16 | Discharge: 2011-10-16 | Disposition: A | Discharge status: Home / Self Care | Payer: 59 | Source: Ambulatory Visit | Attending: General Surgery | Admitting: General Surgery

## 2011-10-16 DIAGNOSIS — K7689 Other specified diseases of liver: Secondary | ICD-10-CM | POA: Insufficient documentation

## 2011-10-16 DIAGNOSIS — M25569 Pain in unspecified knee: Secondary | ICD-10-CM | POA: Insufficient documentation

## 2011-10-16 DIAGNOSIS — F3289 Other specified depressive episodes: Secondary | ICD-10-CM | POA: Insufficient documentation

## 2011-10-16 DIAGNOSIS — F329 Major depressive disorder, single episode, unspecified: Secondary | ICD-10-CM | POA: Insufficient documentation

## 2011-10-16 DIAGNOSIS — K219 Gastro-esophageal reflux disease without esophagitis: Secondary | ICD-10-CM | POA: Insufficient documentation

## 2011-10-16 DIAGNOSIS — Z6837 Body mass index (BMI) 37.0-37.9, adult: Secondary | ICD-10-CM | POA: Insufficient documentation

## 2011-10-16 DIAGNOSIS — Z1231 Encounter for screening mammogram for malignant neoplasm of breast: Secondary | ICD-10-CM | POA: Insufficient documentation

## 2011-10-16 DIAGNOSIS — I10 Essential (primary) hypertension: Secondary | ICD-10-CM | POA: Insufficient documentation

## 2012-10-21 ENCOUNTER — Other Ambulatory Visit (HOSPITAL_COMMUNITY): Payer: Self-pay | Admitting: Internal Medicine

## 2012-10-21 DIAGNOSIS — Z1231 Encounter for screening mammogram for malignant neoplasm of breast: Secondary | ICD-10-CM

## 2012-10-29 ENCOUNTER — Ambulatory Visit (HOSPITAL_COMMUNITY): Payer: 59

## 2012-12-28 ENCOUNTER — Other Ambulatory Visit: Payer: Self-pay

## 2012-12-28 DIAGNOSIS — Z1231 Encounter for screening mammogram for malignant neoplasm of breast: Secondary | ICD-10-CM

## 2012-12-30 ENCOUNTER — Ambulatory Visit
Admission: RE | Admit: 2012-12-30 | Discharge: 2012-12-30 | Disposition: A | Discharge status: Home / Self Care | Payer: PRIVATE HEALTH INSURANCE | Source: Ambulatory Visit

## 2012-12-30 DIAGNOSIS — Z1231 Encounter for screening mammogram for malignant neoplasm of breast: Secondary | ICD-10-CM

## 2013-10-21 ENCOUNTER — Other Ambulatory Visit (HOSPITAL_COMMUNITY): Payer: Self-pay | Admitting: Internal Medicine

## 2013-10-21 ENCOUNTER — Ambulatory Visit (HOSPITAL_COMMUNITY)
Admission: RE | Admit: 2013-10-21 | Discharge: 2013-10-21 | Disposition: A | Discharge status: Home / Self Care | Payer: Managed Care, Other (non HMO) | Source: Ambulatory Visit | Attending: Internal Medicine | Admitting: Internal Medicine

## 2013-10-21 DIAGNOSIS — M79662 Pain in left lower leg: Secondary | ICD-10-CM

## 2013-10-21 DIAGNOSIS — M79609 Pain in unspecified limb: Secondary | ICD-10-CM

## 2013-10-21 NOTE — Progress Notes (Addendum)
*  PRELIMINARY RESULTS* Vascular Ultrasound Left lower extremity venous duplex has been completed.  Preliminary findings: No evidence of DVT or baker's cyst.  Attempted to call report to office number. Office is closed, and there is no other call back number given. Will let patient leave.  Landry Mellow, RDMS, RVT  10/21/2013, 5:07 PM

## 2014-08-07 ENCOUNTER — Other Ambulatory Visit (HOSPITAL_COMMUNITY): Payer: Self-pay | Admitting: Internal Medicine

## 2014-08-07 DIAGNOSIS — Z1231 Encounter for screening mammogram for malignant neoplasm of breast: Secondary | ICD-10-CM

## 2014-08-08 ENCOUNTER — Ambulatory Visit
Admission: RE | Admit: 2014-08-08 | Discharge: 2014-08-08 | Disposition: A | Discharge status: Home / Self Care | Payer: Managed Care, Other (non HMO) | Source: Ambulatory Visit | Attending: Internal Medicine | Admitting: Internal Medicine

## 2014-08-08 ENCOUNTER — Ambulatory Visit (HOSPITAL_COMMUNITY): Payer: PRIVATE HEALTH INSURANCE

## 2014-08-08 DIAGNOSIS — Z1231 Encounter for screening mammogram for malignant neoplasm of breast: Secondary | ICD-10-CM

## 2014-08-09 ENCOUNTER — Other Ambulatory Visit: Payer: Self-pay | Admitting: Internal Medicine

## 2014-08-09 DIAGNOSIS — R928 Other abnormal and inconclusive findings on diagnostic imaging of breast: Secondary | ICD-10-CM

## 2014-08-18 ENCOUNTER — Ambulatory Visit
Admission: RE | Admit: 2014-08-18 | Discharge: 2014-08-18 | Disposition: A | Discharge status: Home / Self Care | Payer: Managed Care, Other (non HMO) | Source: Ambulatory Visit | Attending: Internal Medicine | Admitting: Internal Medicine

## 2014-08-18 ENCOUNTER — Other Ambulatory Visit: Payer: Managed Care, Other (non HMO)

## 2014-08-18 DIAGNOSIS — R928 Other abnormal and inconclusive findings on diagnostic imaging of breast: Secondary | ICD-10-CM

## 2014-10-26 ENCOUNTER — Encounter: Payer: Self-pay | Admitting: Neurology

## 2014-10-26 ENCOUNTER — Ambulatory Visit (INDEPENDENT_AMBULATORY_CARE_PROVIDER_SITE_OTHER): Payer: Managed Care, Other (non HMO) | Admitting: Neurology

## 2014-10-26 VITALS — BP 114/80 | HR 88 | Resp 16 | Ht 64.0 in | Wt 222.0 lb

## 2014-10-26 DIAGNOSIS — G4719 Other hypersomnia: Secondary | ICD-10-CM

## 2014-10-26 DIAGNOSIS — R51 Headache: Secondary | ICD-10-CM | POA: Diagnosis not present

## 2014-10-26 DIAGNOSIS — G4761 Periodic limb movement disorder: Secondary | ICD-10-CM | POA: Diagnosis not present

## 2014-10-26 DIAGNOSIS — G2581 Restless legs syndrome: Secondary | ICD-10-CM

## 2014-10-26 DIAGNOSIS — R0683 Snoring: Secondary | ICD-10-CM

## 2014-10-26 DIAGNOSIS — E669 Obesity, unspecified: Secondary | ICD-10-CM | POA: Diagnosis not present

## 2014-10-26 DIAGNOSIS — R519 Headache, unspecified: Secondary | ICD-10-CM

## 2014-10-26 DIAGNOSIS — E049 Nontoxic goiter, unspecified: Secondary | ICD-10-CM | POA: Diagnosis not present

## 2014-10-26 NOTE — Progress Notes (Addendum)
Subjective:    Patient ID: Adrienne Savage is a 44 y.o. female.  HPI     Star Age, MD, PhD [facility] [address] [place], [place] [address]  Dear Dr. Brigitte Pulse,   I saw your patient, Adrienne Savage, upon your kind request in my neurologic clinic today for initial consultation of her sleep disorder, in particular, concern for underlying obstructive sleep apnea. The patient is unaccompanied today. As you know, Adrienne Savage is a 44 year old right-handed woman with an underlying medical history of hypertension, impaired fasting glucose, depression, smoking, anxiety disorder, obesity, history of syncope, recurrent headaches and knee pain, who reports snoring and excessive daytime somnolence. She had a sleep study on 09/22/2011, interpreted by Dr. Baird Lyons: Sleep efficiency was 83.6%. AHI was 0.4 per hour. REM AHI was 1.2 per hour. She was noted to have moderate snoring, oxygen desaturation nadir was 86%. Her BMI was noted to be 36.9 at the time. She has gained weight, currently at 223 lb. She had gained more weight, then lost some, down to 190 lb, then gained back. Her biggist complaint about her sleep is EDS, her ESS is 16/24 today, fatigue score is 58/63. She endorses rare morning headaches, about once a month or so. She does not have any nocturia. She drinks very little caffeine, maybe one cup of coffee per day, rare sodas. She drinks alcohol rarely. She smokes half a pack per day. She has not recently decreased her smoking. She endorses restless leg symptoms which are not every day but do bother her at times. She does feel like she has to move her legs in her bed and is a restless sleeper. She primarily sleeps on her sides. She snores, as reported by her family. She worries about her goiter. She states, was supposed to have an ultrasound of her neck last year but this never came to fruition for some reason. She had recent blood work in your office which per her  report was fine. She has had some recent irregularities with her menstrual period. I reviewed your office note from 08/25/2014 which you kindly included. She reports a suspected diagnosis of OSA in her mother. Her mom also has symptoms of restless legs. She goes to bed usually by 9 PM because she feels tired but has difficulty falling asleep. She has been on Ambien 5 mg for years. She may have been on Ambien for the past 10 years. She usually falls asleep around midnight or sometimes as late as 1 AM. She spontaneously wakes up around 5 AM. Her rise time for work is not until 7 AM. 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  She has occasional nighttime postnasal drip. She denies any significant reflux issues at night.  Her Past Medical History Is Significant For: Past Medical History  Diagnosis Date  . Hypertension   . Depression   . Anxiety   . Gestational diabetes     x2 pregnancies  . Snoring   . Fatigue   . Leg pain   . Goiter   . Insomnia   . Eczema     Her Past Surgical History Is Significant For: Past Surgical History  Procedure Laterality Date  . Cesarean section  2008  . Multiple tooth extractions  2/12  . Cystectomy      cyst on lip  . Mass excision  06/23/2011    Procedure: EXCISION MASS;  Surgeon: Wynonia Sours, MD;  Location: [facility];  Service: Orthopedics;  Laterality: Left;  left thumb and left index finger    Her Family History Is Significant For: Family History  Problem Relation Age of Onset  . Heart disease Father   . Glaucoma Father   . Thyroid disease Mother   . Hypertension Mother   . Prostate cancer Maternal Grandfather     Her Social History Is Significant For: History   Social History  . Marital Status: Single    Spouse Name: N/A  . Number of Children: 1   . Years of Education: BA   Occupational History  . [place] History Main Topics  . Smoking status: Current Every Day Smoker  -- 0.50 packs/day    Types: Cigarettes  . Smokeless tobacco: Never Used  . Alcohol Use: 4.2 oz/week    7 Standard drinks or equivalent per week     Comment: daily wine  . Drug Use: Yes  . Sexual Activity: Not on file   Other Topics Concern  . None   Social History Narrative   Drinks about 1 cup of coffee a day, occasionally drinks a soda.     Her Allergies Are:  Allergies  Allergen Reactions  . Bactrim [Sulfamethoxazole-Trimethoprim]   . Cyclinex [Tetracycline Hcl] Shortness Of Breath  . Doxycycline Shortness Of Breath  . Minocycline Shortness Of Breath  . Tetracyclines & Related Shortness Of Breath  . Chantix [Varenicline]   . Hydrocortisone Rash  :   Her Current Medications Are:  Outpatient Encounter Prescriptions as of 10/26/2014  Medication Sig  . amLODipine (NORVASC) 5 MG tablet   . hydrochlorothiazide (HYDRODIURIL) 12.5 MG tablet   . VIIBRYD 40 MG TABS   . zolpidem (AMBIEN) 5 MG tablet Take 10 mg by mouth at bedtime as needed. For sleep  . [DISCONTINUED] ALPRAZolam (XANAX) 0.5 MG tablet Take 0.5 mg by mouth at bedtime as needed. For sleep  . [DISCONTINUED] pantoprazole (PROTONIX) 40 MG tablet Take 40 mg by mouth daily.   No facility-administered encounter medications on file as of 10/26/2014.  :  Review of Systems:  Out of a complete 14 point review of systems, all are reviewed and negative with the exception of these symptoms as listed below:   Review of Systems  Constitutional: Positive for fatigue.       Weight gain   Cardiovascular: Positive for leg swelling.  Musculoskeletal:       Joint pain and cramps  Allergic/Immunologic: Positive for environmental allergies.  Neurological: Positive for headaches.       Restless legs, Snoring (that wakes patient up), not witnessed apnea, trouble falling and staying asleep, feels tired after 1 hour of being awake, daytime tiredness, takes naps during day.   Psychiatric/Behavioral:       Depression, anxiety, too much  sleep, not enough sleep, decreased energy, change in appetite     Objective:  Neurologic Exam  Physical Exam  Physical Examination:   Filed Vitals:   10/26/14 0946  BP: 114/80  Pulse: 88  Resp: 16    General Examination: The patient is a very pleasant 44 y.o. female in no acute distress. She appears well-developed and well-nourished and well groomed.   HEENT: Normocephalic, atraumatic, pupils are equal, round and reactive to light and accommodation. Funduscopic exam is normal with sharp disc margins noted. Extraocular tracking is good without limitation to gaze excursion or nystagmus noted. Normal smooth pursuit is noted. Hearing is grossly intact. Tympanic membranes are clear  bilaterally. Face is symmetric with normal facial animation and normal facial sensation. Speech is clear with no dysarthria noted. There is no hypophonia. There is no lip, neck/head, jaw or voice tremor. Neck is supple with full range of passive and active motion. There are no carotid bruits on auscultation. Oropharynx exam reveals: mild mouth dryness, good dental hygiene and moderate airway crowding, due to narrow airway entry, tonsils in place and larger tongue. Mallampati is class II. Tongue protrudes centrally and palate elevates symmetrically. Tonsils are  1+, right more prominent. Neck size is 16.5 inches. She has a fairly symmetrical swelling of her thyroid gland in keeping with a bilateral goiter. She has a Mild overbite. Nasal inspection reveals no significant nasal mucosal bogginess or redness and no septal deviation.   Chest: Clear to auscultation without wheezing, rhonchi or crackles noted.  Heart: S1+S2+0, regular and normal without murmurs, rubs or gallops noted.   Abdomen: Soft, non-tender and non-distended with normal bowel sounds appreciated on auscultation.  Extremities: There is no pitting edema in the distal lower extremities bilaterally. Pedal pulses are intact.  Skin: Warm and dry without  trophic changes noted. There are no varicose veins.  Musculoskeletal: exam reveals no obvious joint deformities, tenderness or joint swelling or erythema with the exception of mild right knee pain and mild swelling noted of her right knee. She has slight decrease of range of motion of her right knee joint.   Neurologically:  Mental status: The patient is awake, alert and oriented in all 4 spheres. Her immediate and remote memory, attention, language skills and fund of knowledge are appropriate. There is no evidence of aphasia, agnosia, apraxia or anomia. Speech is clear with normal prosody and enunciation. Thought process is linear. Mood is normal and affect is normal.  Cranial nerves II - XII are as described above under HEENT exam. In addition: shoulder shrug is normal with equal shoulder height noted. Motor exam: Normal bulk, strength and tone is noted. There is no drift, tremor or rebound. Romberg is negative. Reflexes are 2+ throughout. Babinski: Toes are flexor bilaterally. Fine motor skills and coordination: intact with normal finger taps, normal hand movements, normal rapid alternating patting, normal foot taps and normal foot agility.  Cerebellar testing: No dysmetria or intention tremor on finger to nose testing. Heel to shin is normal on the left but she has difficulty with the right, due to tightness noticed in her right knee area. There is no truncal or gait ataxia.  Sensory exam: intact to light touch, pinprick, vibration, temperature sense in the upper and lower extremities.  Gait, station and balance: She stands easily. No veering to one side is noted. No leaning to one side is noted. Posture is age-appropriate and stance is narrow based. Gait shows normal stride length and normal pace. No problems turning are noted. She turns en bloc. Tandem walk is unremarkable.   Assessment and Plan:   In summary, Adrienne Savage is a very pleasant 44 y.o.-year old female with a history of  hypertension, impaired fasting glucose, depression, smoking, anxiety disorder, obesity, history of syncope, recurrent headaches and knee pain, whose history and physical exam are concerning for obstructive sleep apnea (OSA). She had a sleep study a little over 3 years ago which was negative for OSA but she has gained weight. In addition she endorses restless leg symptoms and leg twitching at night in keeping with PLMD.  I had a long chat with the patient about my findings and the diagnosis of  OSA and RLS, the prognosis and treatment options. We talked about medical treatments, surgical interventions for OSA and non-pharmacological approaches. I explained in particular the risks and ramifications of untreated moderate to severe OSA, especially with respect to developing cardiovascular disease down the Road, including congestive heart failure, difficult to treat hypertension, cardiac arrhythmias, or stroke. Even type 2 diabetes has, in part, been linked to untreated OSA. Symptoms of untreated OSA include daytime sleepiness, memory problems, mood irritability and mood disorder such as depression and anxiety, lack of energy, as well as recurrent headaches, especially morning headaches. We talked about smoking cessation and trying to maintain a healthy lifestyle in general, as well as the importance of weight control. I encouraged the patient to eat healthy, exercise daily and keep well hydrated, to keep a scheduled bedtime and wake time routine, to not skip any meals and eat healthy snacks in between meals. I advised the patient not to drive when feeling sleepy. I recommended the following at this time: sleep study with potential positive airway pressure titration. (We will score hypopneas at 3% and split the sleep study into diagnostic and treatment portion, if the estimated. 2 hour AHI is >15/h, unless mandated otherwise by her insurance requirements).   I explained the sleep test procedure to the patient and also  outlined possible surgical and non-surgical treatment options of OSA, including the use of a custom-made dental device (which would require a referral to a specialist dentist or oral surgeon), upper airway surgical options, such as pillar implants, radiofrequency surgery, tongue base surgery, and UPPP (which would involve a referral to an ENT surgeon). Rarely, jaw surgery such as mandibular advancement may be considered.  I also explained the CPAP treatment option to the patient, who indicated that she would be willing to try CPAP if the need arises. I explained the importance of being compliant with PAP treatment, not only for insurance purposes but primarily to improve Her symptoms, and for the patient's long term health benefit, including to reduce Her cardiovascular risks. I answered all her questions today and the patient was in agreement. I would like to see her back after the sleep study is completed and encouraged her to call with any interim questions, concerns, problems or updates.   She worries about her thyroid enlargement. As I understand, thyroid function was unremarkable recently. She is reassured in that regard but is encouraged to discuss with you whether she should go ahead and have an ultrasound of her thyroid gland.   Thank you very much for allowing me to participate in the care of this nice patient. If I can be of any further assistance to you please do not hesitate to call me at [phone].  Sincerely,   Star Age, MD, PhD

## 2014-10-26 NOTE — Patient Instructions (Signed)

## 2015-08-23 ENCOUNTER — Other Ambulatory Visit: Payer: Self-pay | Admitting: Internal Medicine

## 2015-08-23 DIAGNOSIS — E049 Nontoxic goiter, unspecified: Secondary | ICD-10-CM

## 2015-09-04 DIAGNOSIS — I10 Essential (primary) hypertension: Secondary | ICD-10-CM | POA: Insufficient documentation

## 2015-09-12 ENCOUNTER — Ambulatory Visit
Admission: RE | Admit: 2015-09-12 | Discharge: 2015-09-12 | Disposition: A | Discharge status: Home / Self Care | Payer: Managed Care, Other (non HMO) | Source: Ambulatory Visit | Attending: Internal Medicine | Admitting: Internal Medicine

## 2015-09-12 DIAGNOSIS — E049 Nontoxic goiter, unspecified: Secondary | ICD-10-CM

## 2016-05-26 ENCOUNTER — Encounter: Payer: Self-pay | Admitting: Emergency Medicine

## 2016-05-26 ENCOUNTER — Emergency Department
Admission: EM | Admit: 2016-05-26 | Discharge: 2016-05-26 | Disposition: A | Discharge status: Home / Self Care | Payer: Managed Care, Other (non HMO) | Attending: Emergency Medicine | Admitting: Emergency Medicine

## 2016-05-26 DIAGNOSIS — S46212A Strain of muscle, fascia and tendon of other parts of biceps, left arm, initial encounter: Secondary | ICD-10-CM | POA: Insufficient documentation

## 2016-05-26 DIAGNOSIS — Y929 Unspecified place or not applicable: Secondary | ICD-10-CM | POA: Diagnosis not present

## 2016-05-26 DIAGNOSIS — Z79899 Other long term (current) drug therapy: Secondary | ICD-10-CM | POA: Insufficient documentation

## 2016-05-26 DIAGNOSIS — Y9389 Activity, other specified: Secondary | ICD-10-CM | POA: Insufficient documentation

## 2016-05-26 DIAGNOSIS — Y999 Unspecified external cause status: Secondary | ICD-10-CM | POA: Insufficient documentation

## 2016-05-26 DIAGNOSIS — I10 Essential (primary) hypertension: Secondary | ICD-10-CM | POA: Diagnosis not present

## 2016-05-26 DIAGNOSIS — S4992XA Unspecified injury of left shoulder and upper arm, initial encounter: Secondary | ICD-10-CM | POA: Diagnosis present

## 2016-05-26 DIAGNOSIS — W1800XA Striking against unspecified object with subsequent fall, initial encounter: Secondary | ICD-10-CM | POA: Insufficient documentation

## 2016-05-26 DIAGNOSIS — F17210 Nicotine dependence, cigarettes, uncomplicated: Secondary | ICD-10-CM | POA: Diagnosis not present

## 2016-05-26 MED ORDER — CYCLOBENZAPRINE HCL 10 MG PO TABS
5.0000 mg | ORAL_TABLET | Freq: Once | ORAL | Status: AC
  Administered 2016-05-26: 5 mg via ORAL
  Filled 2016-05-26: qty 1

## 2016-05-26 MED ORDER — DICLOFENAC SODIUM 1 % TD GEL: 2.0000 g | Freq: Every day | TRANSDERMAL | 0 refills | Status: AC

## 2016-05-26 MED ORDER — TRAMADOL HCL 50 MG PO TABS: 50.0000 mg | ORAL_TABLET | Freq: Four times a day (QID) | ORAL | 0 refills | Status: AC | PRN

## 2016-05-26 MED ORDER — CYCLOBENZAPRINE HCL 5 MG PO TABS: 5.0000 mg | ORAL_TABLET | Freq: Three times a day (TID) | ORAL | 0 refills | Status: AC | PRN

## 2016-05-26 MED ORDER — TRAMADOL HCL 50 MG PO TABS
50.0000 mg | ORAL_TABLET | Freq: Once | ORAL | Status: AC
  Administered 2016-05-26: 50 mg via ORAL
  Filled 2016-05-26: qty 1

## 2016-05-26 NOTE — ED Provider Notes (Signed)
[facility] Provider Note  ____________________________________________  Time seen: Approximately 9:14 PM  I have reviewed the triage vital signs and the nursing notes.   HISTORY  Chief Complaint Shoulder Injury    HPI Adrienne Savage is a 46 y.o. female that presents to the emergency department with left upper arm pain since last week. Patient states that she was moving stuff from a [employer]. She lost her balance and grabbed up with her left hand onto the [employer]. Patient states that her arm pulled really hard. Patient did not fall on arm. Patient states that it began bruising over her upper arm and the pain is getting worse. She cannot take NSAIDs due to her stomach. Patient denies fever, shortness breath, chest pain, nausea, vomiting, abdominal pain, numbness, tingling.    Past Medical History:  Diagnosis Date  . Anxiety   . Depression   . Eczema   . Fatigue   . Gestational diabetes    x2 pregnancies  . Goiter   . Hypertension   . Insomnia   . Leg pain   . Snoring     Patient Active Problem List   Diagnosis Date Noted  . Obesity (BMI 30-39.9) 08/26/2011    Past Surgical History:  Procedure Laterality Date  . CESAREAN SECTION  2008  . CYSTECTOMY     cyst on lip  . MASS EXCISION  06/23/2011   Procedure: EXCISION MASS;  Surgeon: Wynonia Sours, MD;  Location: [facility];  Service: Orthopedics;  Laterality: Left;  left thumb and left index finger  . MULTIPLE TOOTH EXTRACTIONS  2/12    Prior to Admission medications   Medication Sig Start Date End Date Taking? Authorizing Provider  amLODipine (NORVASC) 5 MG tablet  10/12/14   Historical Provider, MD  cyclobenzaprine (FLEXERIL) 5 MG tablet Take 1 tablet (5 mg total) by mouth 3 (three) times daily as needed for muscle spasms. 05/26/16 06/02/16  Laban Emperor, PA-C  diclofenac sodium (VOLTAREN) 1 % GEL Apply 2 g topically daily. 05/26/16 05/31/16  Laban Emperor, PA-C   hydrochlorothiazide (HYDRODIURIL) 12.5 MG tablet  10/12/14   Historical Provider, MD  traMADol (ULTRAM) 50 MG tablet Take 1 tablet (50 mg total) by mouth every 6 (six) hours as needed. 05/26/16 05/26/17  Laban Emperor, PA-C  VIIBRYD 40 MG TABS  09/22/14   Historical Provider, MD  zolpidem (AMBIEN) 5 MG tablet Take 10 mg by mouth at bedtime as needed. For sleep    Historical Provider, MD    Allergies Bactrim [sulfamethoxazole-trimethoprim]; Cyclinex [tetracycline hcl]; Doxycycline; Minocycline; Tetracyclines & related; Chantix [varenicline]; and Hydrocortisone  Family History  Problem Relation Age of Onset  . Heart disease Father   . Glaucoma Father   . Thyroid disease Mother   . Hypertension Mother   . Prostate cancer Maternal Grandfather     Social History Social History  Substance Use Topics  . Smoking status: Current Every Day Smoker    Packs/day: 0.50    Types: Cigarettes  . Smokeless tobacco: Never Used  . Alcohol use 4.2 oz/week    7 Standard drinks or equivalent per week     Comment: daily wine     Review of Systems  Constitutional: No fever/chills ENT: No upper respiratory complaints. Cardiovascular: No chest pain. Respiratory: No cough. No SOB. Gastrointestinal: No abdominal pain.  No nausea, no vomiting.  Skin: Negative for rash, abrasions, lacerations. Neurological: Negative for headaches, numbness or tingling   ____________________________________________  PHYSICAL EXAM:  VITAL SIGNS: ED Triage Vitals  Enc Vitals Group     BP 05/26/16 2031 (!) 128/94     Pulse Rate 05/26/16 2031 80     Resp 05/26/16 2031 16     Temp 05/26/16 2031 99.2 F (37.3 C)     Temp Source 05/26/16 2031 Oral     SpO2 05/26/16 2031 99 %     Weight 05/26/16 2029 234 lb (106.1 kg)     Height 05/26/16 2029 5\' 5"  (1.651 m)     Head Circumference --      Peak Flow --      Pain Score 05/26/16 2029 7     Pain Loc --      Pain Edu? --      Excl. in [place]? --      Constitutional:  Alert and oriented. Well appearing and in no acute distress. Eyes: Conjunctivae are normal. PERRL. EOMI. Head: Atraumatic. ENT:      Ears:      Nose: No congestion/rhinnorhea.      Mouth/Throat: Mucous membranes are moist.  Neck: No stridor.   Cardiovascular: Normal rate, regular rhythm.  Good peripheral circulation. 2+ radial pulses. Respiratory: Normal respiratory effort without tachypnea or retractions. Lungs CTAB. Good air entry to the bases with no decreased or absent breath sounds. Musculoskeletal: Full range of motion to all extremities. No gross deformities appreciated. No tenderness to palpation over shoulder or elbow. Tender to palpation over biceps. Neurologic:  Normal speech and language. No gross focal neurologic deficits are appreciated.   Skin:  Skin is warm, dry and intact. No rash noted. Psychiatric: Mood and affect are normal. Speech and behavior are normal. Patient exhibits appropriate insight and judgement.   ____________________________________________   LABS (all labs ordered are listed, but only abnormal results are displayed)  Labs Reviewed - No data to display ____________________________________________  EKG   ____________________________________________  RADIOLOGY   No results found.  ____________________________________________    PROCEDURES  Procedure(s) performed:    Procedures    Medications  traMADol (ULTRAM) tablet 50 mg (not administered)  cyclobenzaprine (FLEXERIL) tablet 5 mg (not administered)     ____________________________________________   INITIAL IMPRESSION / ASSESSMENT AND PLAN / ED COURSE  Pertinent labs & imaging results that were available during my care of the patient were reviewed by me and considered in my medical decision making (see chart for details).  Review of the [place] CSRS was performed in accordance of the [place] prior to dispensing any controlled drugs.     Patient's diagnosis is consistent with  biceps strain. Vital signs and exam are reassuring. Patient did not fall on arm. No indication for imaging. Patient is tender over biceps. Patient will be discharged home with prescriptions for a short course of tramadol and Flexeril. Patient is to follow up with PCP as directed. Patient is given ED precautions to return to the ED for any worsening or new symptoms.     ____________________________________________  FINAL CLINICAL IMPRESSION(S) / ED DIAGNOSES  Final diagnoses:  Strain of left biceps muscle, initial encounter      NEW MEDICATIONS STARTED DURING THIS VISIT:  New Prescriptions   CYCLOBENZAPRINE (FLEXERIL) 5 MG TABLET    Take 1 tablet (5 mg total) by mouth 3 (three) times daily as needed for muscle spasms.   DICLOFENAC SODIUM (VOLTAREN) 1 % GEL    Apply 2 g topically daily.   TRAMADOL (ULTRAM) 50 MG TABLET    Take 1 tablet (50  mg total) by mouth every 6 (six) hours as needed.        This chart was dictated using voice recognition software/Dragon. Despite best efforts to proofread, errors can occur which can change the meaning. Any change was purely unintentional.    Laban Emperor, PA-C 05/26/16 [place], MD 05/26/16 [phone]

## 2016-05-26 NOTE — ED Triage Notes (Signed)
[place] a few days ago while moving, fell and hit left shoulder.  Since fall, left shoulder hurting with certain arm movements.

## 2016-05-26 NOTE — ED Notes (Signed)
See triage note, pt fell on [employer] when she was moving. Pt states for 2 days after she had bruising but not as much pain as she has now. Pt states she is unable to lay on left shoulder when lying down and states "it hurts and it spasms." Pt denies swelling to area, does not hear popping when doing ROM to arm at this time.

## 2016-05-27 DIAGNOSIS — M179 Osteoarthritis of knee, unspecified: Secondary | ICD-10-CM | POA: Insufficient documentation

## 2016-05-27 DIAGNOSIS — M1710 Unilateral primary osteoarthritis, unspecified knee: Secondary | ICD-10-CM | POA: Insufficient documentation

## 2016-05-27 DIAGNOSIS — F419 Anxiety disorder, unspecified: Secondary | ICD-10-CM | POA: Insufficient documentation

## 2016-07-18 DIAGNOSIS — Z6841 Body Mass Index (BMI) 40.0 and over, adult: Secondary | ICD-10-CM

## 2016-11-04 ENCOUNTER — Other Ambulatory Visit: Payer: Self-pay | Admitting: Physician Assistant

## 2016-11-04 DIAGNOSIS — M7661 Achilles tendinitis, right leg: Secondary | ICD-10-CM

## 2016-11-07 ENCOUNTER — Ambulatory Visit
Admission: RE | Admit: 2016-11-07 | Discharge: 2016-11-07 | Disposition: A | Discharge status: Home / Self Care | Payer: Managed Care, Other (non HMO) | Source: Ambulatory Visit | Attending: Physician Assistant | Admitting: Physician Assistant

## 2016-11-07 DIAGNOSIS — M722 Plantar fascial fibromatosis: Secondary | ICD-10-CM | POA: Diagnosis not present

## 2016-11-07 DIAGNOSIS — M76821 Posterior tibial tendinitis, right leg: Secondary | ICD-10-CM | POA: Diagnosis not present

## 2016-11-07 DIAGNOSIS — M7661 Achilles tendinitis, right leg: Secondary | ICD-10-CM

## 2016-11-07 DIAGNOSIS — M7671 Peroneal tendinitis, right leg: Secondary | ICD-10-CM | POA: Diagnosis not present

## 2016-11-20 ENCOUNTER — Other Ambulatory Visit: Payer: Self-pay | Admitting: *Deleted

## 2016-11-20 NOTE — Patient Outreach (Signed)
[name]  11/20/2016  Adrienne Savage 09-Dec-1970 [id number]   Subjective: Telephone call to patient's home  number, no answer, left HIPAA compliant voicemail message, and requested call back.   Objective: Per chart and [employer] iCollaborative review, patient hospitalized for morbid obesity.  Status post LAPAROSCOPY, SURGICAL; GASTRIC RESTRICTIVE PROCEDURE; WITH GASTRIC BYPASS AND ROUX-EN-Y GASTROENTEROSTOMY (ROUX LIMB 150 CM OR LESS) on 11/12/16.    Assessment: Received [employer] Transition of care referral on 11/19/16.  Transition of care follow up pending patient contact.     Plan: RNCM will call patient for 2nd telephone outreach attempt, transition of care follow up, within 10 business days if no return call.    Adrienne Savage H. Annia Friendly, BSN, [place] [facility] Telephonic CM Phone: [phone] Fax: [phone]

## 2016-11-21 ENCOUNTER — Other Ambulatory Visit: Payer: Self-pay | Admitting: *Deleted

## 2016-11-21 ENCOUNTER — Encounter: Payer: Self-pay | Admitting: *Deleted

## 2016-11-21 NOTE — Patient Outreach (Addendum)
[facility]) Care Management  11/21/2016  Adrienne Savage 06/21/1970 [id number]   Subjective: Received voicemail message from Adrienne Savage, states she is returning call, and requested call back.  Telephone call to patient's home/ mobile number, spoke with patient, and HIPAA verified.  Discussed [facility] Care Management [employer] Transition of care follow up, patient voiced understanding, and is in agreement to follow up.   Patient states she is doing good overall, having trouble with the volume of fluid intake that is required for hydration, gets full easy, and does not like to take the vitamins.  Advised patient to follow up with MD's office regarding her concerns today, she voiced understanding, and states she will follow up.  States she has a follow up appointment with surgeon on 11/25/16.  States she was informed by MD's office that her hemoglobin was low, would need to start iron drops, and is planning to start today. Patient voices understanding of medical diagnosis, surgery,  and treatment plan.  States she is accessing his Christella Scheuermann benefits as needed and has family medical leave act [employer]) in place.  Patient states she does not have any education material, transition of care, care coordination, disease management, disease monitoring, transportation, community resource, or pharmacy needs at this time.  States she is very appreciative of the follow up and is in agreement to receive [facility] Management information.   Objective: Per chart and [employer] iCollaborative review, patient hospitalized for morbid obesity.  Status post LAPAROSCOPY, SURGICAL; GASTRIC RESTRICTIVE PROCEDURE; WITH GASTRIC BYPASS AND ROUX-EN-Y GASTROENTEROSTOMY (ROUX LIMB 150 CM OR LESS) on 11/12/16.    Assessment: Received [employer] Transition of care referral on 11/19/16. Transition of care follow up completed, no care management needs, and will proceed with case closure.    Plan: RNCM will send patient successful  outreach letter, [facility] pamphlet, and magnet. RNCM will send case closure due to follow up completed / no care management needs request to [facility].    Adrienne Savage, BSN, [place] [facility] Telephonic CM Phone: [phone] Fax: [phone]

## 2017-01-15 DIAGNOSIS — E669 Obesity, unspecified: Secondary | ICD-10-CM | POA: Insufficient documentation

## 2017-01-15 DIAGNOSIS — B009 Herpesviral infection, unspecified: Secondary | ICD-10-CM | POA: Insufficient documentation

## 2017-01-15 DIAGNOSIS — Z720 Tobacco use: Secondary | ICD-10-CM | POA: Insufficient documentation

## 2017-01-15 DIAGNOSIS — F17200 Nicotine dependence, unspecified, uncomplicated: Secondary | ICD-10-CM | POA: Insufficient documentation

## 2017-01-15 DIAGNOSIS — N870 Mild cervical dysplasia: Secondary | ICD-10-CM | POA: Insufficient documentation

## 2017-01-16 ENCOUNTER — Ambulatory Visit: Payer: Managed Care, Other (non HMO)

## 2017-01-16 ENCOUNTER — Ambulatory Visit (INDEPENDENT_AMBULATORY_CARE_PROVIDER_SITE_OTHER): Payer: Managed Care, Other (non HMO) | Admitting: Podiatry

## 2017-01-16 DIAGNOSIS — M722 Plantar fascial fibromatosis: Secondary | ICD-10-CM

## 2017-01-16 DIAGNOSIS — M76821 Posterior tibial tendinitis, right leg: Secondary | ICD-10-CM

## 2017-01-16 DIAGNOSIS — M7661 Achilles tendinitis, right leg: Secondary | ICD-10-CM | POA: Diagnosis not present

## 2017-01-16 MED ORDER — NONFORMULARY OR COMPOUNDED ITEM: 2 refills | Status: DC

## 2017-01-16 NOTE — Progress Notes (Signed)
   Subjective:    Patient ID: Adrienne Savage, female    DOB: 1971-03-24, 46 y.o.   MRN: [id number]  HPI    Review of Systems  All other systems reviewed and are negative.      Objective:   Physical Exam        Assessment & Plan:
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
## 2017-01-22 MED ORDER — BETAMETHASONE SOD PHOS & ACET 6 (3-3) MG/ML IJ SUSP: 3.0000 mg | Freq: Once | INTRAMUSCULAR | Status: DC

## 2017-02-10 ENCOUNTER — Ambulatory Visit (INDEPENDENT_AMBULATORY_CARE_PROVIDER_SITE_OTHER): Payer: Managed Care, Other (non HMO) | Admitting: Podiatry

## 2017-02-10 ENCOUNTER — Encounter: Payer: Self-pay | Admitting: Podiatry

## 2017-02-10 DIAGNOSIS — M76821 Posterior tibial tendinitis, right leg: Secondary | ICD-10-CM

## 2017-02-10 DIAGNOSIS — M722 Plantar fascial fibromatosis: Secondary | ICD-10-CM

## 2017-02-10 DIAGNOSIS — M7661 Achilles tendinitis, right leg: Secondary | ICD-10-CM

## 2017-02-10 MED ORDER — METHYLPREDNISOLONE 4 MG PO TBPK
ORAL_TABLET | ORAL | 0 refills | Status: DC
Start: 2017-02-10 — End: 2017-02-10

## 2017-02-10 MED ORDER — METHYLPREDNISOLONE 4 MG PO TBPK: ORAL_TABLET | ORAL | 0 refills | Status: DC

## 2017-02-10 MED ORDER — GABAPENTIN 100 MG PO CAPS: 100.0000 mg | ORAL_CAPSULE | Freq: Three times a day (TID) | ORAL | 1 refills | Status: DC

## 2017-02-10 NOTE — Addendum Note (Signed)
Addended by: Graceann Congress D on: 02/10/2017 10:00 AM   Modules accepted: Orders

## 2017-02-10 NOTE — Progress Notes (Signed)
   Subjective: Patient presents today for burning, throbbing, aching pain and tenderness in the right heel and achilles onset approximately 11 months ago. Patient states that there is been minimal improvement and she continues to experience pain and tenderness. She will use of the anti-inflammatory injections did nothing to alleviate her symptoms. She believes plantar fascial brace is helping and she is wearing it with good supportive shoe gear. Patient has an appointment with Liliane Channel, Pedorthist on 02/18/2017 for custom molded orthotics. She also states that antifungal for pain cream dispensed through [facility] helps alleviate symptoms. She presents for further treatment and evaluation  Past Medical History:  Diagnosis Date  . Anxiety   . Depression   . Eczema   . Fatigue   . Gestational diabetes    x2 pregnancies  . Goiter   . Hypertension   . Insomnia   . Leg pain   . Snoring      Objective: Physical Exam General: The patient is alert and oriented x3 in no acute distress.  Dermatology: Skin is warm, dry and supple bilateral lower extremities. Negative for open lesions or macerations bilateral.   Vascular: Dorsalis Pedis and Posterior Tibial pulses palpable bilateral.  Capillary fill time is immediate to all digits.  Neurological: Epicritic and protective threshold intact bilateral.   Musculoskeletal: Tenderness to palpation at the medial calcaneal tubercale and through the insertion of the plantar fascia of the right foot. Pain on palpation noted to the posterior tibial tendon of the right foot. Pain on palpation noted to the posterior tubercle of the right calcaneus at the insertion of the Achilles tendon consistent with retrocalcaneal bursitis.All other joints range of motion within normal limits bilateral. Strength 5/5 in all groups bilateral.   MRI Exam:  1. Moderate mid distal Achilles tendinopathy/tendinitis with suspected small interstitial tear is more distally. 2.  Mild changes of plantar fasciitis. 3. Mild posterior tibialis and peroneal longus tendinopathy. 4. Mild tibiotalar and midfoot degenerative changes for age. No fracture or AVN.  Assessment: 1. Plantar fasciitis right 2. PT tendinitis right 3. Achilles tendinitis right  Plan of Care:  1. Patient evaluated.  2. Today we will prescribe a prescription for a Medrol Dosepak 3. Prescription for gabapentin 100 mg 4. Patient has appointment with Liliane Channel, Pedorthist on 02/18/2017 for custom orthotics 5. Continue plantar fascial brace 6. Return to clinic in 4 weeks, if there is no improvement we will consider another round of anti-inflammatory injections     Edrick Kins, DPM [facility]  Dr. Edrick Kins, DPM    [address], [place] [address]                Office [phone]  Fax [phone]

## 2017-02-18 ENCOUNTER — Ambulatory Visit (INDEPENDENT_AMBULATORY_CARE_PROVIDER_SITE_OTHER): Payer: Self-pay | Admitting: Orthotics

## 2017-02-18 DIAGNOSIS — M722 Plantar fascial fibromatosis: Secondary | ICD-10-CM

## 2017-02-18 DIAGNOSIS — M7661 Achilles tendinitis, right leg: Secondary | ICD-10-CM

## 2017-02-18 DIAGNOSIS — M76821 Posterior tibial tendinitis, right leg: Secondary | ICD-10-CM

## 2017-02-23 NOTE — Progress Notes (Signed)

## 2017-03-10 ENCOUNTER — Ambulatory Visit: Payer: Managed Care, Other (non HMO) | Admitting: Podiatry

## 2017-03-10 ENCOUNTER — Other Ambulatory Visit: Payer: Managed Care, Other (non HMO) | Admitting: Orthotics

## 2017-03-14 ENCOUNTER — Other Ambulatory Visit: Payer: Managed Care, Other (non HMO) | Admitting: Orthotics

## 2017-04-08 ENCOUNTER — Other Ambulatory Visit: Payer: Managed Care, Other (non HMO)

## 2017-04-08 ENCOUNTER — Other Ambulatory Visit: Payer: Managed Care, Other (non HMO) | Admitting: Orthotics

## 2017-04-09 ENCOUNTER — Ambulatory Visit: Payer: Managed Care, Other (non HMO)

## 2017-04-09 DIAGNOSIS — M722 Plantar fascial fibromatosis: Secondary | ICD-10-CM

## 2017-04-09 DIAGNOSIS — M7661 Achilles tendinitis, right leg: Secondary | ICD-10-CM

## 2017-04-09 NOTE — Progress Notes (Signed)
Patient comes in today to be casted for foot orthotics.  She was scanned on 02/18/17, however [facility] never received scan.  Order has already been placed for orthotics.    Patient informed that she will be notified once orthotics come in.

## 2017-05-06 ENCOUNTER — Ambulatory Visit: Payer: Managed Care, Other (non HMO) | Admitting: Orthotics

## 2017-05-06 DIAGNOSIS — M7661 Achilles tendinitis, right leg: Secondary | ICD-10-CM

## 2017-05-06 DIAGNOSIS — M722 Plantar fascial fibromatosis: Secondary | ICD-10-CM

## 2017-05-06 DIAGNOSIS — M76821 Posterior tibial tendinitis, right leg: Secondary | ICD-10-CM

## 2017-05-06 NOTE — Progress Notes (Signed)
Patient came in today to pick up custom made foot orthotics.  The goals were accomplished and the patient reported no dissatisfaction with said orthotics.  Patient was advised of breakin period and how to report any issues. 

## 2017-05-27 ENCOUNTER — Emergency Department: Payer: Managed Care, Other (non HMO)

## 2017-05-27 ENCOUNTER — Emergency Department
Admission: EM | Admit: 2017-05-27 | Discharge: 2017-05-27 | Disposition: A | Discharge status: Home / Self Care | Payer: Managed Care, Other (non HMO) | Attending: Emergency Medicine | Admitting: Emergency Medicine

## 2017-05-27 ENCOUNTER — Other Ambulatory Visit: Payer: Self-pay

## 2017-05-27 DIAGNOSIS — I10 Essential (primary) hypertension: Secondary | ICD-10-CM | POA: Diagnosis not present

## 2017-05-27 DIAGNOSIS — R1010 Upper abdominal pain, unspecified: Secondary | ICD-10-CM | POA: Diagnosis not present

## 2017-05-27 DIAGNOSIS — Z79899 Other long term (current) drug therapy: Secondary | ICD-10-CM | POA: Diagnosis not present

## 2017-05-27 DIAGNOSIS — F17210 Nicotine dependence, cigarettes, uncomplicated: Secondary | ICD-10-CM | POA: Diagnosis not present

## 2017-05-27 DIAGNOSIS — R109 Unspecified abdominal pain: Secondary | ICD-10-CM | POA: Diagnosis present

## 2017-05-27 LAB — URINALYSIS, COMPLETE (UACMP) WITH MICROSCOPIC
Bacteria, UA: NONE SEEN
Bilirubin Urine: NEGATIVE
Glucose, UA: NEGATIVE mg/dL
HGB URINE DIPSTICK: NEGATIVE
Ketones, ur: NEGATIVE mg/dL
Leukocytes, UA: NEGATIVE
NITRITE: NEGATIVE
Protein, ur: NEGATIVE mg/dL
SPECIFIC GRAVITY, URINE: 1.025 (ref 1.005–1.030)
pH: 5 (ref 5.0–8.0)

## 2017-05-27 LAB — CBC
HEMATOCRIT: 37.8 % (ref 35.0–47.0)
Hemoglobin: 12.7 g/dL (ref 12.0–16.0)
MCH: 29 pg (ref 26.0–34.0)
MCHC: 33.6 g/dL (ref 32.0–36.0)
MCV: 86.5 fL (ref 80.0–100.0)
Platelets: 232 10*3/uL (ref 150–440)
RBC: 4.38 MIL/uL (ref 3.80–5.20)
RDW: 13.1 % (ref 11.5–14.5)
WBC: 5.6 10*3/uL (ref 3.6–11.0)

## 2017-05-27 LAB — COMPREHENSIVE METABOLIC PANEL
ALBUMIN: 3.7 g/dL (ref 3.5–5.0)
ALK PHOS: 91 U/L (ref 38–126)
ALT: 9 U/L — ABNORMAL LOW (ref 14–54)
AST: 16 U/L (ref 15–41)
Anion gap: 7 (ref 5–15)
BUN: 8 mg/dL (ref 6–20)
CALCIUM: 8.9 mg/dL (ref 8.9–10.3)
CO2: 27 mmol/L (ref 22–32)
Chloride: 103 mmol/L (ref 101–111)
Creatinine, Ser: 0.91 mg/dL (ref 0.44–1.00)
GFR calc Af Amer: >60 mL/min (ref >60)
GFR calc non Af Amer: >60 mL/min (ref >60)
GLUCOSE: 105 mg/dL — AB (ref 65–99)
POTASSIUM: 3.9 mmol/L (ref 3.5–5.1)
Sodium: 137 mmol/L (ref 135–145)
TOTAL PROTEIN: 7.5 g/dL (ref 6.5–8.1)
Total Bilirubin: 0.7 mg/dL (ref 0.3–1.2)

## 2017-05-27 LAB — LIPASE, BLOOD: Lipase: 20 U/L (ref 11–51)

## 2017-05-27 LAB — POCT PREGNANCY, URINE: Preg Test, Ur: NEGATIVE

## 2017-05-27 MED ORDER — IOPAMIDOL (ISOVUE-300) INJECTION 61%
100.0000 mL | Freq: Once | INTRAVENOUS | Status: AC | PRN
  Administered 2017-05-27: 100 mL via INTRAVENOUS

## 2017-05-27 MED ORDER — MORPHINE SULFATE (PF) 4 MG/ML IV SOLN
4.0000 mg | Freq: Once | INTRAVENOUS | Status: AC
  Administered 2017-05-27: 4 mg via INTRAVENOUS
  Filled 2017-05-27: qty 1

## 2017-05-27 MED ORDER — IOPAMIDOL (ISOVUE-300) INJECTION 61%
30.0000 mL | Freq: Once | INTRAVENOUS | Status: AC | PRN
  Administered 2017-05-27: 30 mL via ORAL

## 2017-05-27 MED ORDER — SODIUM CHLORIDE 0.9 % IV BOLUS (SEPSIS)
1000.0000 mL | Freq: Once | INTRAVENOUS | Status: AC
  Administered 2017-05-27: 1000 mL via INTRAVENOUS

## 2017-05-27 MED ORDER — FAMOTIDINE 20 MG PO TABS: 20.0000 mg | ORAL_TABLET | Freq: Two times a day (BID) | ORAL | 0 refills | Status: DC

## 2017-05-27 MED ORDER — ONDANSETRON HCL 4 MG/2ML IJ SOLN
4.0000 mg | Freq: Once | INTRAMUSCULAR | Status: AC
  Administered 2017-05-27: 4 mg via INTRAVENOUS
  Filled 2017-05-27: qty 2

## 2017-05-27 MED ORDER — DICYCLOMINE HCL 20 MG PO TABS: 20.0000 mg | ORAL_TABLET | Freq: Three times a day (TID) | ORAL | 0 refills | Status: DC | PRN

## 2017-05-27 NOTE — ED Notes (Signed)
Pt alert and oriented X4, active, cooperative, pt in NAD. RR even and unlabored, color WNL.  Pt informed to return if any life threatening symptoms occur.  Discharge and followup instructions reviewed.  

## 2017-05-27 NOTE — ED Notes (Signed)
Pt drinking oral CT contrast

## 2017-05-27 NOTE — Discharge Instructions (Signed)
Return to the emergency department for new, worsening, or persistent severe abdominal pain, vomiting, weakness, blood in the stool, chest pain, or any other new or worsening symptoms that concern you.  Follow-up with your bariatric surgeon on Friday as scheduled.
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
## 2017-05-27 NOTE — ED Provider Notes (Signed)
[facility] Emergency Department Provider Note ____________________________________________   First MD Initiated Contact with Patient 05/27/17 1328     (approximate)  I have reviewed the triage vital signs and the nursing notes.   HISTORY  Chief Complaint Abdominal Pain    HPI Adrienne Savage is a 47 y.o. female with past medical history as noted below and status post gastric bypass approximately 6 months ago who presents with abdominal pain for the last 4 days, primarily in the upper abdomen, nonradiating, and associated with nausea but no vomiting.  Patient reports chronic constipation but no acute change in this; she states she has a bowel movement every 4-5 days normally.  Her last bowel movement was 4 days ago and induced by a suppository, but this did not improve her pain.  No prior history of this pain.  No urinary symptoms or fever.   Past Medical History:  Diagnosis Date  . Anxiety   . Depression   . Eczema   . Fatigue   . Gestational diabetes    x2 pregnancies  . Goiter   . Hypertension   . Insomnia   . Leg pain   . Snoring     Patient Active Problem List   Diagnosis Date Noted  . Anemia 01/15/2017  . 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 Hypertensive disorder 01/15/2017  . Obesity 01/15/2017  . Smoker 01/15/2017  . Morbid obesity with BMI of 40.0-44.9, adult ([place]) 07/18/2016  . Anxiety 05/27/2016  . Hypertension, essential 05/27/2016  . Osteoarthritis of knee 05/27/2016  . Essential hypertension 09/04/2015  . Obesity with body mass index 30 or greater 08/26/2011    Past Surgical History:  Procedure Laterality Date  . CESAREAN SECTION  2008  . CYSTECTOMY     cyst on lip  . GASTRIC BYPASS    . MASS EXCISION  06/23/2011   Procedure: EXCISION MASS;  Surgeon: Wynonia Sours, MD;  Location: [facility];  Service: Orthopedics;  Laterality: Left;  left  thumb and left index finger  . MULTIPLE TOOTH EXTRACTIONS  2/12    Prior to Admission medications   Medication Sig Start Date End Date Taking? Authorizing Provider  amLODipine (NORVASC) 5 MG tablet  10/12/14   [provider]  Cyanocobalamin (NASCOBAL) 500 MCG/0.1ML SOLN Place into the nose.    [provider]  dicyclomine (BENTYL) 20 MG tablet Take 1 tablet (20 mg total) by mouth 3 (three) times daily as needed for up to 4 days for spasms. 05/27/17 05/31/17  Arta Silence, MD  famotidine (PEPCID) 20 MG tablet Take 1 tablet (20 mg total) by mouth 2 (two) times daily for 7 days. 05/27/17 06/03/17  Arta Silence, MD  gabapentin (NEURONTIN) 100 MG capsule Take 1 capsule (100 mg total) 3 (three) times daily by mouth. 02/10/17   Edrick Kins, DPM  methylPREDNISolone (MEDROL DOSEPAK) 4 MG TBPK tablet 6 day dose pack - take as directed 02/10/17   Edrick Kins, DPM  NONFORMULARY OR COMPOUNDED [place] See pharmacy note 01/16/17   Edrick Kins, DPM  Pediatric Multivit-Minerals-C (MVW CHEWABLE MULTIVITAMIN PO) Take by mouth. Take 2 tablets daily    [provider]  VIIBRYD 40 MG TABS  09/22/14   [provider]  zolpidem (AMBIEN) 5 MG tablet Take 10 mg by mouth at bedtime as needed. For sleep    [provider]    Allergies Cyclinex [  tetracycline hcl]; Demeclocycline; Doxycycline; Minocycline; Tetracycline; and Tetracyclines & related  Family History  Problem Relation Age of Onset  . Heart disease Father   . Glaucoma Father   . Thyroid disease Mother   . Hypertension Mother   . Prostate cancer Maternal Grandfather     Social History Social History   Tobacco Use  . Smoking status: Current Every Day Smoker    Packs/day: 0.50    Types: Cigarettes  . Smokeless tobacco: Never Used  Substance Use Topics  . Alcohol use: Yes    Alcohol/week: 4.2 oz    Types: 7 Standard drinks or equivalent per week    Comment: daily wine  . Drug use: Yes     Review of Systems  Constitutional: No fever. Eyes: No redness. ENT: No sore throat. Cardiovascular: Denies chest pain. Respiratory: Denies shortness of breath. Gastrointestinal: Positive for nausea and abdominal pain.  Genitourinary: Negative for dysuria.  Musculoskeletal: Negative for back pain. Skin: Negative for rash. Neurological: Negative for headache.   ____________________________________________   PHYSICAL EXAM:  VITAL SIGNS: ED Triage Vitals [05/27/17 1114]  Enc Vitals Group     BP 128/84     Pulse Rate 79     Resp 18     Temp 98.6 F (37 C)     Temp Source Oral     SpO2 100 %     Weight 194 lb (88 kg)     Height 5\' 5"  (1.651 m)     Head Circumference      Peak Flow      Pain Score 8     Pain Loc      Pain Edu?      Excl. in [place]?     Constitutional: Alert and oriented.  Relatively well appearing and in no acute distress. Eyes: Conjunctivae are normal.  Head: Atraumatic. Nose: No congestion/rhinnorhea. Mouth/Throat: Mucous membranes are moist.   Neck: Normal range of motion.  Cardiovascular: Normal rate, regular rhythm. Grossly normal heart sounds.  Good peripheral circulation. Respiratory: Normal respiratory effort.  No retractions. Lungs CTAB. Gastrointestinal: Soft with moderate upper abdominal tenderness. No distention.  Genitourinary: No flank tenderness. Musculoskeletal: No lower extremity edema.  Extremities warm and well perfused.  Neurologic:  Normal speech and language. No gross focal neurologic deficits are appreciated.  Skin:  Skin is warm and dry. No rash noted. Psychiatric: Mood and affect are normal. Speech and behavior are normal.  ____________________________________________   LABS (all labs ordered are listed, but only abnormal results are displayed)  Labs Reviewed  COMPREHENSIVE METABOLIC PANEL - Abnormal; Notable for the following components:      Result Value   Glucose, Bld 105 (*)    ALT 9 (*)    All other components  within normal limits  URINALYSIS, COMPLETE (UACMP) WITH MICROSCOPIC - Abnormal; Notable for the following components:   Color, Urine AMBER (*)    APPearance HAZY (*)    Squamous Epithelial / LPF 6-30 (*)    All other components within normal limits  LIPASE, BLOOD  CBC  POC URINE PREG, ED  POCT PREGNANCY, URINE   ____________________________________________  EKG   ____________________________________________  RADIOLOGY  CT abdomen: No acute findings  ____________________________________________   PROCEDURES  Procedure(s) performed: No  Procedures  Critical Care performed: No ____________________________________________   INITIAL IMPRESSION / ASSESSMENT AND PLAN / ED COURSE  Pertinent labs & imaging results that were available during my care of the patient were reviewed by me and considered in my  medical decision making (see chart for details).  47 year old female with past medical history as noted above and status post gastric bypass approximately 6 months ago presents with upper abdominal pain for the last several days associated with nausea but no vomiting, as well as chronic constipation.  Past medical records reviewed in Epic and are noncontributory.  On exam, the patient is relatively well-appearing, vital signs are normal, however she is significantly tender in the upper abdomen.  There is no significant distention.  Presentation is concerning for possible SBO or ileus especially given her somewhat recent surgery, versus gastritis, PUD, biliary colic or other hepatobiliary cause, or less likely colitis or diverticulitis.  Plan: Labs, IV fluids, analgesia, and CT abdomen.    ----------------------------------------- 8:42 PM on 05/27/2017 -----------------------------------------  CT revealed no concerning acute findings.  Patient's pain significantly improved in the ED and she has had no vomiting.  Overall presentation is consistent with possible gastritis from  the remaining stomach, versus mild gastroenteritis.  The patient feels well to go home.  I will give meds for symptomatic treatment, and she states that she has follow-up with her bariatric surgeon already arranged 2 days from now.  She was instructed to keep this appointment.  Return precautions given, and she expressed understanding.  ____________________________________________   FINAL CLINICAL IMPRESSION(S) / ED DIAGNOSES  Final diagnoses:  Pain of upper abdomen      NEW MEDICATIONS STARTED DURING THIS VISIT:  Discharge Medication List as of 05/27/2017  5:23 PM    START taking these medications   Details  dicyclomine (BENTYL) 20 MG tablet Take 1 tablet (20 mg total) by mouth 3 (three) times daily as needed for up to 4 days for spasms., Starting Wed 05/27/2017, Until Sun 05/31/2017, Print    famotidine (PEPCID) 20 MG tablet Take 1 tablet (20 mg total) by mouth 2 (two) times daily for 7 days., Starting Wed 05/27/2017, Until Wed 06/03/2017, Print         Note:  This document was prepared using Dragon voice recognition software and may include unintentional dictation errors.     Arta Silence, MD 05/27/17 2043

## 2017-05-27 NOTE — ED Notes (Signed)
First Nurse Note:  Complaining of pain upper abdomen and chest with nausea starting on Sun. Getting worse.

## 2017-05-27 NOTE — ED Triage Notes (Signed)
Pt arrives with upper epigastric abd pain that began Saturday. Hx gastric bypass last year. States thought gas so tool suppository. States feels bloated. States uncomfortable while standing or sitting but ok if laying down. States drinking cramps belly up. Nausea. No vomiting.   Alert, oriented, ambulatory.

## 2017-05-27 NOTE — ED Notes (Signed)
Patient transported to CT 

## 2017-05-27 NOTE — ED Notes (Signed)
Pt back from CT

## 2017-05-27 NOTE — ED Notes (Signed)
Pt up to bathroom to attempt to given urine sample. Pt alert and oriented X4, active, cooperative, pt in NAD. RR even and unlabored, color WNL.

## 2017-09-11 LAB — HM MAMMOGRAPHY

## 2017-09-11 LAB — HM PAP SMEAR
HM PAP: NORMAL
HM Pap smear: NORMAL

## 2017-09-30 ENCOUNTER — Telehealth: Payer: Self-pay | Admitting: *Deleted

## 2017-09-30 NOTE — Telephone Encounter (Signed)
Copied from [place] [id number]. Topic: Appointment Scheduling - New Patient >> Sep 30, 2017  9:44 AM Rutherford Nail, NT wrote: New patient has been scheduled for your office. Provider: Dr Aundra Dubin Date of Appointment: 12/17/17 at 9:00am  Route to department's PEC pool.

## 2017-11-10 ENCOUNTER — Other Ambulatory Visit: Payer: Self-pay

## 2017-11-10 ENCOUNTER — Emergency Department
Admission: EM | Admit: 2017-11-10 | Discharge: 2017-11-10 | Disposition: A | Discharge status: Home / Self Care | Payer: Managed Care, Other (non HMO) | Attending: Emergency Medicine | Admitting: Emergency Medicine

## 2017-11-10 ENCOUNTER — Encounter: Payer: Self-pay | Admitting: Emergency Medicine

## 2017-11-10 ENCOUNTER — Emergency Department: Payer: Managed Care, Other (non HMO)

## 2017-11-10 DIAGNOSIS — K2900 Acute gastritis without bleeding: Secondary | ICD-10-CM | POA: Insufficient documentation

## 2017-11-10 DIAGNOSIS — Z87891 Personal history of nicotine dependence: Secondary | ICD-10-CM | POA: Diagnosis not present

## 2017-11-10 DIAGNOSIS — Z79899 Other long term (current) drug therapy: Secondary | ICD-10-CM | POA: Diagnosis not present

## 2017-11-10 DIAGNOSIS — R1012 Left upper quadrant pain: Secondary | ICD-10-CM

## 2017-11-10 DIAGNOSIS — I10 Essential (primary) hypertension: Secondary | ICD-10-CM | POA: Diagnosis not present

## 2017-11-10 LAB — COMPREHENSIVE METABOLIC PANEL
ALBUMIN: 4 g/dL (ref 3.5–5.0)
ALT: 9 U/L (ref 0–44)
AST: 14 U/L — AB (ref 15–41)
Alkaline Phosphatase: 89 U/L (ref 38–126)
Anion gap: 8 (ref 5–15)
BUN: 9 mg/dL (ref 6–20)
CO2: 28 mmol/L (ref 22–32)
CREATININE: 0.92 mg/dL (ref 0.44–1.00)
Calcium: 8.8 mg/dL — ABNORMAL LOW (ref 8.9–10.3)
Chloride: 105 mmol/L (ref 98–111)
GFR calc Af Amer: >60 mL/min (ref >60)
GFR calc non Af Amer: >60 mL/min (ref >60)
GLUCOSE: 101 mg/dL — AB (ref 70–99)
POTASSIUM: 3.7 mmol/L (ref 3.5–5.1)
Sodium: 141 mmol/L (ref 135–145)
TOTAL PROTEIN: 7.3 g/dL (ref 6.5–8.1)
Total Bilirubin: 0.7 mg/dL (ref 0.3–1.2)

## 2017-11-10 LAB — URINALYSIS, COMPLETE (UACMP) WITH MICROSCOPIC
Bacteria, UA: NONE SEEN
Bilirubin Urine: NEGATIVE
Glucose, UA: NEGATIVE mg/dL
Hgb urine dipstick: NEGATIVE
Ketones, ur: NEGATIVE mg/dL
NITRITE: NEGATIVE
Protein, ur: NEGATIVE mg/dL
RBC / HPF: >50 RBC/hpf — ABNORMAL HIGH (ref 0–5)
SPECIFIC GRAVITY, URINE: 1.027 (ref 1.005–1.030)
pH: 5 (ref 5.0–8.0)

## 2017-11-10 LAB — CBC
HEMATOCRIT: 36.5 % (ref 35.0–47.0)
Hemoglobin: 12.2 g/dL (ref 12.0–16.0)
MCH: 29.7 pg (ref 26.0–34.0)
MCHC: 33.5 g/dL (ref 32.0–36.0)
MCV: 88.5 fL (ref 80.0–100.0)
PLATELETS: 234 10*3/uL (ref 150–440)
RBC: 4.13 MIL/uL (ref 3.80–5.20)
RDW: 13.2 % (ref 11.5–14.5)
WBC: 6.7 10*3/uL (ref 3.6–11.0)

## 2017-11-10 LAB — LIPASE, BLOOD: Lipase: 23 U/L (ref 11–51)

## 2017-11-10 LAB — TROPONIN I: Troponin I: <0.03 ng/mL (ref <0.03)

## 2017-11-10 LAB — POCT PREGNANCY, URINE: PREG TEST UR: NEGATIVE

## 2017-11-10 MED ORDER — MORPHINE SULFATE (PF) 4 MG/ML IV SOLN
4.0000 mg | Freq: Once | INTRAVENOUS | Status: AC
  Administered 2017-11-10: 4 mg via INTRAVENOUS
  Filled 2017-11-10: qty 1

## 2017-11-10 MED ORDER — GI COCKTAIL ~~LOC~~
30.0000 mL | Freq: Once | ORAL | Status: AC
  Administered 2017-11-10: 30 mL via ORAL
  Filled 2017-11-10: qty 30

## 2017-11-10 MED ORDER — LIDOCAINE VISCOUS HCL 2 % MT SOLN: 15.0000 mL | OROMUCOSAL | 0 refills | Status: DC | PRN

## 2017-11-10 MED ORDER — ONDANSETRON HCL 4 MG/2ML IJ SOLN
4.0000 mg | Freq: Once | INTRAMUSCULAR | Status: AC
  Administered 2017-11-10: 4 mg via INTRAVENOUS
  Filled 2017-11-10: qty 2

## 2017-11-10 MED ORDER — SUCRALFATE 1 G PO TABS: 1.0000 g | ORAL_TABLET | Freq: Four times a day (QID) | ORAL | 0 refills | Status: DC

## 2017-11-10 MED ORDER — SODIUM CHLORIDE 0.9 % IV BOLUS
1000.0000 mL | Freq: Once | INTRAVENOUS | Status: AC
  Administered 2017-11-10: 1000 mL via INTRAVENOUS

## 2017-11-10 MED ORDER — IOHEXOL 300 MG/ML  SOLN
100.0000 mL | Freq: Once | INTRAMUSCULAR | Status: AC | PRN
  Administered 2017-11-10: 100 mL via INTRAVENOUS

## 2017-11-10 NOTE — ED Notes (Signed)
Patient transported to CT 

## 2017-11-10 NOTE — ED Triage Notes (Addendum)
Patient ambulatory to triage with steady gait, without difficulty or distress noted; pt reports left upper abd pain since Sunday accomp by nausea; denies hx of same; normally takes protonix daily--has been out for last 4 days and just started back today

## 2017-11-10 NOTE — Discharge Instructions (Addendum)
Please seek medical attention for any high fevers, chest pain, shortness of breath, change in behavior, persistent vomiting, bloody stool or any other new or concerning symptoms.  

## 2017-11-10 NOTE — ED Provider Notes (Signed)
[facility] Emergency Department Provider Note   ____________________________________________   I have reviewed the triage vital signs and the nursing notes.   HISTORY  Chief Complaint Abdominal Pain   History limited by: Not Limited   HPI Adrienne Savage is a 47 y.o. female who presents to the emergency department today because of concerns for abdominal pain.  Located left upper quadrant.  It is constant.  It is severe.  Is sharp.  Started 2 days ago.  Patient has a history of gastric bypass.  She states 2 days ago she did have first drink of alcohol since the surgery roughly 1 year ago.  She also had an Aleve.  The patient has been off for GERD medications.  Patient has had some nausea but no vomiting.  Has had some loose stools but no blood in her stools or black or tarry stools.  She denies any fevers although has had some chills.    Per medical record review patient has a history of gastric bypass.  Past Medical History:  Diagnosis Date  . Anxiety   . Depression   . Eczema   . Fatigue   . Gestational diabetes    x2 pregnancies  . Goiter   . Hypertension   . Insomnia   . Leg pain   . Snoring     Patient Active Problem List   Diagnosis Date Noted  . Anemia 01/15/2017  . 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 Hypertensive disorder 01/15/2017  . Obesity 01/15/2017  . Smoker 01/15/2017  . Morbid obesity with BMI of 40.0-44.9, adult ([place]) 07/18/2016  . Anxiety 05/27/2016  . Hypertension, essential 05/27/2016  . Osteoarthritis of knee 05/27/2016  . Essential hypertension 09/04/2015  . Obesity with body mass index 30 or greater 08/26/2011    Past Surgical History:  Procedure Laterality Date  . CESAREAN SECTION  2008  . CYSTECTOMY     cyst on lip  . GASTRIC BYPASS    . MASS EXCISION  06/23/2011   Procedure: EXCISION MASS;  Surgeon: Wynonia Sours, MD;  Location: [facility];  Service: Orthopedics;  Laterality: Left;  left thumb and left index finger  . MULTIPLE TOOTH EXTRACTIONS  2/12    Prior to Admission medications   Medication Sig Start Date End Date Taking? Authorizing Provider  amLODipine (NORVASC) 5 MG tablet  10/12/14   [provider]  Cyanocobalamin (NASCOBAL) 500 MCG/0.1ML SOLN Place into the nose.    [provider]  dicyclomine (BENTYL) 20 MG tablet Take 1 tablet (20 mg total) by mouth 3 (three) times daily as needed for up to 4 days for spasms. 05/27/17 05/31/17  Arta Silence, MD  famotidine (PEPCID) 20 MG tablet Take 1 tablet (20 mg total) by mouth 2 (two) times daily for 7 days. 05/27/17 06/03/17  Arta Silence, MD  gabapentin (NEURONTIN) 100 MG capsule Take 1 capsule (100 mg total) 3 (three) times daily by mouth. 02/10/17   Edrick Kins, DPM  methylPREDNISolone (MEDROL DOSEPAK) 4 MG TBPK tablet 6 day dose pack - take as directed 02/10/17   Edrick Kins, DPM  NONFORMULARY OR COMPOUNDED [place] See pharmacy note 01/16/17   Edrick Kins, DPM  Pediatric Multivit-Minerals-C (MVW CHEWABLE MULTIVITAMIN PO) Take by mouth. Take 2 tablets daily    [provider]  VIIBRYD 40 MG TABS  09/22/14   [provider]  zolpidem (AMBIEN) 5  MG tablet Take 10 mg by mouth at bedtime as needed. For sleep    [provider]    Allergies Cyclinex [tetracycline hcl]; Demeclocycline; Doxycycline; Minocycline; Tetracycline; and Tetracyclines & related  Family History  Problem Relation Age of Onset  . Heart disease Father   . Glaucoma Father   . Thyroid disease Mother   . Hypertension Mother   . Prostate cancer Maternal Grandfather     Social History Social History   Tobacco Use  . Smoking status: Former Smoker    Packs/day: 0.50    Types: Cigarettes  . Smokeless tobacco: Never Used  Substance Use Topics  . Alcohol use: Yes    Alcohol/week: 7.0 standard drinks    Types: 7 Standard drinks or  equivalent per week    Comment: daily wine  . Drug use: Yes    Review of Systems Constitutional: No fever/chills Eyes: No visual changes. ENT: No sore throat. Cardiovascular: Denies chest pain. Respiratory: Denies shortness of breath. Gastrointestinal: Positive for abdominal pain. Nausea.  Genitourinary: Negative for dysuria. Musculoskeletal: Negative for back pain. Skin: Negative for rash. Neurological: Negative for headaches, focal weakness or numbness.  ____________________________________________   PHYSICAL EXAM:  VITAL SIGNS: ED Triage Vitals  Enc Vitals Group     BP 11/10/17 2016 122/73     Pulse Rate 11/10/17 2016 69     Resp --      Temp 11/10/17 2016 98.5 F (36.9 C)     Temp Source 11/10/17 2016 Oral     SpO2 11/10/17 2016 93 %     Weight 11/10/17 2018 190 lb (86.2 kg)     Height 11/10/17 2018 5\' 4"  (1.626 m)     Head Circumference --      Peak Flow --      Pain Score 11/10/17 2020 8   Constitutional: Alert and oriented.  Eyes: Conjunctivae are normal.  ENT      Head: Normocephalic and atraumatic.      Nose: No congestion/rhinnorhea.      Mouth/Throat: Mucous membranes are moist.      Neck: No stridor. Hematological/Lymphatic/Immunilogical: No cervical lymphadenopathy. Cardiovascular: Normal rate, regular rhythm.  No murmurs, rubs, or gallops.  Respiratory: Normal respiratory effort without tachypnea nor retractions. Breath sounds are clear and equal bilaterally. No wheezes/rales/rhonchi. Gastrointestinal: Soft and tender to palpation in the left upper quadrant.  Genitourinary: Deferred Musculoskeletal: Normal range of motion in all extremities. No lower extremity edema. Neurologic:  Normal speech and language. No gross focal neurologic deficits are appreciated.  Skin:  Skin is warm, dry and intact. No rash noted. Psychiatric: Mood and affect are normal. Speech and behavior are normal. Patient exhibits appropriate insight and  judgment.  ____________________________________________    LABS (pertinent positives/negatives)  Trop <0.03 Upreg neg Lipase 23 CBC wnl CMP na 141, k 3.7, glu 101, cr 0.92 UA cloudy, >50 rbc, 6-10 wbc, 11-20 squamous  ____________________________________________   EKG  I, Nance Pear, attending physician, personally viewed and interpreted this EKG  EKG Time: 2021 Rate: 66 Rhythm: normal sinus rhythm Axis: normal Intervals: qtc 463 QRS: narrow, q waves v1, v2 ST changes: no st elevation Impression: abnormal ekg   ____________________________________________    RADIOLOGY  CT abd/pel Consistent with gastritis.  ____________________________________________   PROCEDURES  Procedures  ____________________________________________   INITIAL IMPRESSION / ASSESSMENT AND PLAN / ED COURSE  Pertinent labs & imaging results that were available during my care of the patient were reviewed by me and considered in my  medical decision making (see chart for details).   Patient presented to the emergency department today because of concerns for left upper quadrant abdominal pain.  Patient has a history of gastric bypass so CT scan was ordered to evaluate.  No obvious concerning findings with the bypass, no perforation although there was some thickening and inflammation concerning for possible gastritis or enteritis.  Discussed this with the patient.  Will plan on adding sucralfate and viscous lidocaine to the patient's regimen of antacids.  Discussed importance of follow-up with her surgeon.  ____________________________________________   FINAL CLINICAL IMPRESSION(S) / ED DIAGNOSES  Final diagnoses:  Left upper quadrant pain  Acute gastritis, presence of bleeding unspecified, unspecified gastritis type     Note: This dictation was prepared with Dragon dictation. Any transcriptional errors that result from this process are unintentional     Nance Pear,  MD 11/10/17 2306

## 2017-12-17 ENCOUNTER — Ambulatory Visit (INDEPENDENT_AMBULATORY_CARE_PROVIDER_SITE_OTHER): Payer: Managed Care, Other (non HMO) | Admitting: Internal Medicine

## 2017-12-17 ENCOUNTER — Encounter: Payer: Self-pay | Admitting: Internal Medicine

## 2017-12-17 VITALS — BP 130/94 | HR 70 | Temp 98.6°F | Ht 64.0 in | Wt 195.2 lb

## 2017-12-17 DIAGNOSIS — Z720 Tobacco use: Secondary | ICD-10-CM | POA: Diagnosis not present

## 2017-12-17 DIAGNOSIS — F419 Anxiety disorder, unspecified: Secondary | ICD-10-CM

## 2017-12-17 DIAGNOSIS — G43909 Migraine, unspecified, not intractable, without status migrainosus: Secondary | ICD-10-CM

## 2017-12-17 DIAGNOSIS — Z1159 Encounter for screening for other viral diseases: Secondary | ICD-10-CM

## 2017-12-17 DIAGNOSIS — F17200 Nicotine dependence, unspecified, uncomplicated: Secondary | ICD-10-CM

## 2017-12-17 DIAGNOSIS — G43011 Migraine without aura, intractable, with status migrainosus: Secondary | ICD-10-CM

## 2017-12-17 DIAGNOSIS — R319 Hematuria, unspecified: Secondary | ICD-10-CM | POA: Diagnosis not present

## 2017-12-17 DIAGNOSIS — Z0184 Encounter for antibody response examination: Secondary | ICD-10-CM

## 2017-12-17 DIAGNOSIS — G4700 Insomnia, unspecified: Secondary | ICD-10-CM

## 2017-12-17 DIAGNOSIS — I10 Essential (primary) hypertension: Secondary | ICD-10-CM

## 2017-12-17 DIAGNOSIS — R232 Flushing: Secondary | ICD-10-CM | POA: Diagnosis not present

## 2017-12-17 DIAGNOSIS — K259 Gastric ulcer, unspecified as acute or chronic, without hemorrhage or perforation: Secondary | ICD-10-CM

## 2017-12-17 DIAGNOSIS — R519 Headache, unspecified: Secondary | ICD-10-CM

## 2017-12-17 DIAGNOSIS — J309 Allergic rhinitis, unspecified: Secondary | ICD-10-CM

## 2017-12-17 DIAGNOSIS — E042 Nontoxic multinodular goiter: Secondary | ICD-10-CM

## 2017-12-17 DIAGNOSIS — Z9109 Other allergy status, other than to drugs and biological substances: Secondary | ICD-10-CM | POA: Insufficient documentation

## 2017-12-17 DIAGNOSIS — R51 Headache: Secondary | ICD-10-CM

## 2017-12-17 DIAGNOSIS — J329 Chronic sinusitis, unspecified: Secondary | ICD-10-CM

## 2017-12-17 DIAGNOSIS — E049 Nontoxic goiter, unspecified: Secondary | ICD-10-CM

## 2017-12-17 DIAGNOSIS — E0590 Thyrotoxicosis, unspecified without thyrotoxic crisis or storm: Secondary | ICD-10-CM

## 2017-12-17 LAB — FOLLICLE STIMULATING HORMONE: FSH: 108.8 m[IU]/mL

## 2017-12-17 MED ORDER — MONTELUKAST SODIUM 10 MG PO TABS: 10.0000 mg | ORAL_TABLET | Freq: Every day | ORAL | 3 refills | Status: DC

## 2017-12-17 MED ORDER — SUMATRIPTAN SUCCINATE 50 MG PO TABS: ORAL_TABLET | ORAL | 2 refills | Status: DC

## 2017-12-17 MED ORDER — VARENICLINE TARTRATE 1 MG PO TABS: 1.0000 mg | ORAL_TABLET | Freq: Two times a day (BID) | ORAL | 0 refills | Status: DC

## 2017-12-17 MED ORDER — FLUCONAZOLE 150 MG PO TABS: 150.0000 mg | ORAL_TABLET | Freq: Once | ORAL | 0 refills | Status: AC

## 2017-12-17 MED ORDER — AZITHROMYCIN 250 MG PO TABS: ORAL_TABLET | ORAL | 0 refills | Status: DC

## 2017-12-17 NOTE — Patient Instructions (Addendum)
Stop Clonidine 0.1 qhs Sumatriptan tablets What is this medicine? SUMATRIPTAN (soo ma TRIP tan) is used to treat migraines with or without aura. An aura is a strange feeling or visual disturbance that warns you of an attack. It is not used to prevent migraines. This medicine may be used for other purposes; ask your health care provider or pharmacist if you have questions. COMMON BRAND NAME(S): Imitrex, Migraine Pack What should I tell my health care provider before I take this medicine? They need to know if you have any of these conditions: -circulation problems in fingers and toes -diabetes -heart disease -high blood pressure -high cholesterol -history of irregular heartbeat -history of stroke -kidney disease -liver disease -postmenopausal or surgical removal of uterus and ovaries -seizures -smoke tobacco -stomach or intestine problems -an unusual or allergic reaction to sumatriptan, other medicines, foods, dyes, or preservatives -pregnant or trying to get pregnant -breast-feeding How should I use this medicine? Take this medicine by mouth with a glass of water. Follow the directions on the prescription label. This medicine is taken at the first symptoms of a migraine. It is not for everyday use. If your migraine headache returns after one dose, you can take another dose as directed. You must leave at least 2 hours between doses, and do not take more than 100 mg as a single dose. Do not take more than 200 mg total in any 24 hour period. If there is no improvement at all after the first dose, do not take a second dose without talking to your doctor or health care professional. Do not take your medicine more often than directed. Talk to your pediatrician regarding the use of this medicine in children. Special care may be needed. Overdosage: If you think you have taken too much of this medicine contact a poison control center or emergency room at once. NOTE: This medicine is only for you.  Do not share this medicine with others. What if I miss a dose? This does not apply; this medicine is not for regular use. What may interact with this medicine? Do not take this medicine with any of the following medicines: -cocaine -ergot alkaloids like dihydroergotamine, ergonovine, ergotamine, methylergonovine -feverfew -MAOIs like Carbex, Eldepryl, Marplan, Nardil, and Parnate -other medicines for migraine headache like almotriptan, eletriptan, frovatriptan, naratriptan, rizatriptan, zolmitriptan -tryptophan This medicine may also interact with the following medications: -certain medicines for depression, anxiety, or psychotic disturbances This list may not describe all possible interactions. Give your health care provider a list of all the medicines, herbs, non-prescription drugs, or dietary supplements you use. Also tell them if you smoke, drink alcohol, or use illegal drugs. Some items may interact with your medicine. What should I watch for while using this medicine? Only take this medicine for a migraine headache. Take it if you get warning symptoms or at the start of a migraine attack. It is not for regular use to prevent migraine attacks. You may get drowsy or dizzy. Do not drive, use machinery, or do anything that needs mental alertness until you know how this medicine affects you. To reduce dizzy or fainting spells, do not sit or stand up quickly, especially if you are an older patient. Alcohol can increase drowsiness, dizziness and flushing. Avoid alcoholic drinks. Smoking cigarettes may increase the risk of heart-related side effects from using this medicine. If you take migraine medicines for 10 or more days a month, your migraines may get worse. Keep a diary of headache days and medicine use. Contact your  healthcare professional if your migraine attacks occur more frequently. What side effects may I notice from receiving this medicine? Side effects that you should report to your  doctor or health care professional as soon as possible: -allergic reactions like skin rash, itching or hives, swelling of the face, lips, or tongue -bloody or watery diarrhea -hallucination, loss of contact with reality -pain, tingling, numbness in the face, hands, or feet -seizures -signs and symptoms of a blood clot such as breathing problems; changes in vision; chest pain; severe, sudden headache; pain, swelling, warmth in the leg; trouble speaking; sudden numbness or weakness of the face, arm, or leg -signs and symptoms of a dangerous change in heartbeat or heart rhythm like chest pain; dizziness; fast or irregular heartbeat; palpitations, feeling faint or lightheaded; falls; breathing problems -signs and symptoms of a stroke like changes in vision; confusion; trouble speaking or understanding; severe headaches; sudden numbness or weakness of the face, arm, or leg; trouble walking; dizziness; loss of balance or coordination -stomach pain Side effects that usually do not require medical attention (report to your doctor or health care professional if they continue or are bothersome): -changes in taste -facial flushing -headache -muscle cramps -muscle pain -nausea, vomiting -weak or tired This list may not describe all possible side effects. Call your doctor for medical advice about side effects. You may report side effects to FDA at 1-800-FDA-1088. Where should I keep my medicine? Keep out of the reach of children. Store at room temperature between 2 and 30 degrees C (36 and 86 degrees F). Throw away any unused medicine after the expiration date. NOTE: This sheet is a summary. It may not cover all possible information. If you have questions about this medicine, talk to your doctor, pharmacist, or health care provider.  2018 Elsevier/Gold Standard (2015-04-19 12:38:23)   Migraine Headache A migraine headache is an intense, throbbing pain on one side or both sides of the head. Migraines  may also cause other symptoms, such as nausea, vomiting, and sensitivity to light and noise. What are the causes? Doing or taking certain things may also trigger migraines, such as:  Alcohol.  Smoking.  Medicines, such as: ? Medicine used to treat chest pain (nitroglycerine). ? Birth control pills. ? Estrogen pills. ? Certain blood pressure medicines.  Aged cheeses, chocolate, or caffeine.  Foods or drinks that contain nitrates, glutamate, aspartame, or tyramine.  Physical activity.  Other things that may trigger a migraine include:  Menstruation.  Pregnancy.  Hunger.  Stress, lack of sleep, too much sleep, or fatigue.  Weather changes.  What increases the risk? The following factors may make you more likely to experience migraine headaches:  Age. Risk increases with age.  Family history of migraine headaches.  Being Caucasian.  Depression and anxiety.  Obesity.  Being a woman.  Having a hole in the heart (patent foramen ovale) or other heart problems.  What are the signs or symptoms? The main symptom of this condition is pulsating or throbbing pain. Pain may:  Happen in any area of the head, such as on one side or both sides.  Interfere with daily activities.  Get worse with physical activity.  Get worse with exposure to bright lights or loud noises.  Other symptoms may include:  Nausea.  Vomiting.  Dizziness.  General sensitivity to bright lights, loud noises, or smells.  Before you get a migraine, you may get warning signs that a migraine is developing (aura). An aura may include:  Seeing flashing lights  or having blind spots.  Seeing bright spots, halos, or zigzag lines.  Having tunnel vision or blurred vision.  Having numbness or a tingling feeling.  Having trouble talking.  Having muscle weakness.  How is this diagnosed? A migraine headache can be diagnosed based on:  Your symptoms.  A physical exam.  Tests, such as CT  scan or MRI of the head. These imaging tests can help rule out other causes of headaches.  Taking fluid from the spine (lumbar puncture) and analyzing it (cerebrospinal fluid analysis, or CSF analysis).  How is this treated? A migraine headache is usually treated with medicines that:  Relieve pain.  Relieve nausea.  Prevent migraines from coming back.  Treatment may also include:  Acupuncture.  Lifestyle changes like avoiding foods that trigger migraines.  Follow these instructions at home: Medicines  Take over-the-counter and prescription medicines only as told by your health care provider.  Do not drive or use heavy machinery while taking prescription pain medicine.  To prevent or treat constipation while you are taking prescription pain medicine, your health care provider may recommend that you: ? Drink enough fluid to keep your urine clear or pale yellow. ? Take over-the-counter or prescription medicines. ? Eat foods that are high in fiber, such as fresh fruits and vegetables, whole grains, and beans. ? Limit foods that are high in fat and processed sugars, such as fried and sweet foods. Lifestyle  Avoid alcohol use.  Do not use any products that contain nicotine or tobacco, such as cigarettes and e-cigarettes. If you need help quitting, ask your health care provider.  Get at least 8 hours of sleep every night.  Limit your stress. General instructions   Keep a journal to find out what may trigger your migraine headaches. For example, write down: ? What you eat and drink. ? How much sleep you get. ? Any change to your diet or medicines.  If you have a migraine: ? Avoid things that make your symptoms worse, such as bright lights. ? It may help to lie down in a dark, quiet room. ? Do not drive or use heavy machinery. ? Ask your health care provider what activities are safe for you while you are experiencing symptoms.  Keep all follow-up visits as told by your  health care provider. This is important. Contact a health care provider if:  You develop symptoms that are different or more severe than your usual migraine symptoms. Get help right away if:  Your migraine becomes severe.  You have a fever.  You have a stiff neck.  You have vision loss.  Your muscles feel weak or like you cannot control them.  You start to lose your balance often.  You develop trouble walking.  You faint. This information is not intended to replace advice given to you by your health care provider. Make sure you discuss any questions you have with your health care provider. Document Released: 03/17/2005 Document Revised: 10/05/2015 Document Reviewed: 09/03/2015 Elsevier Interactive Patient Education  2017 [employer].

## 2017-12-17 NOTE — Progress Notes (Signed)
Pre visit review using our clinic review tool, if applicable. No additional management support is needed unless otherwise documented below in the visit note. 

## 2017-12-18 LAB — URINALYSIS, ROUTINE W REFLEX MICROSCOPIC
BILIRUBIN URINE: NEGATIVE
GLUCOSE, UA: NEGATIVE
Hgb urine dipstick: NEGATIVE
Hyaline Cast: NONE SEEN /LPF
Ketones, ur: NEGATIVE
Nitrite: NEGATIVE
PROTEIN: NEGATIVE
Specific Gravity, Urine: 1.025 (ref 1.001–1.03)
pH: 6 (ref 5.0–8.0)

## 2017-12-18 LAB — MEASLES/MUMPS/RUBELLA IMMUNITY
Mumps IgG: 160 AU/mL
Rubella: 17.4 index
Rubeola IgG: <25 AU/mL — ABNORMAL LOW

## 2017-12-21 ENCOUNTER — Encounter: Payer: Self-pay | Admitting: Internal Medicine

## 2017-12-21 ENCOUNTER — Telehealth: Payer: Self-pay

## 2017-12-21 NOTE — Progress Notes (Addendum)
Chief Complaint  Patient presents with  . Establish Care   F/u  1 .migraines uncontrolled normally q Friday through Sunday-Monday x 4 months. She had migraines 2 years ago and stress/perfumes would trigger and make worse but they stopped and now back with nausea, photophobia, bothered by sound. Only sleeping 4 hours at night. Denies osa sx's. No caffeine, no FH migraines. Stressors include work and home She has had sinus issues and tried Sudafed, Tylenol unable to use nose sprays. 2. Allergies she used to get allergy shots  3. Anxiety uncontrolled and sleep uncontrolled f/u CBC Dr. Kasandra Knudsen  4. GI ulcers 12/2017 [facility] on medications  5. Reviewed labs 11/16/17 CMET, CBC, A1C 5.2, vit D 36.2, lipid normal B12 ,and TSH normal  6. H/o thyroid goiter does not currently f/u with endocrine  09/12/15 thyroid US Heterogeneous echotexture of thyroid, potentially on the spectrum of thyroiditis. No significant internal flow.  Bilateral nodules. No thyroid nodule meets criteria for biopsy or surveillance, as designated by the newly established ACR TI-RADS criteria.  7. HTN BP uncontrolled today on norvasc 2.5 mg qd  8. Smoker wants refill of chantix 1 mg bid to continue cessation   Review of Systems  Constitutional: Negative for weight loss.  HENT: Positive for sinus pain. Negative for hearing loss.   Eyes: Positive for photophobia. Negative for blurred vision.  Respiratory: Negative for shortness of breath.   Cardiovascular: Negative for chest pain.  Gastrointestinal: Negative for abdominal pain.  Genitourinary:       +hot flashes    Skin: Negative for rash.  Neurological: Positive for headaches.  Psychiatric/Behavioral: The patient is nervous/anxious.    Past Medical History:  Diagnosis Date  . Allergy   . Anxiety   . Depression   . Eczema   . Fatigue   . Frequent headaches   . Gastric ulcer   . GERD (gastroesophageal reflux disease)   . Goiter   . History of chicken pox   . HPV in  female   . Hypertension   . Insomnia   . Leg pain   . Snoring   . UTI (urinary tract infection)    Past Surgical History:  Procedure Laterality Date  . CESAREAN SECTION  2008  . CYSTECTOMY     cyst on lip  . GASTRIC BYPASS     8/18  . MASS EXCISION  06/23/2011   Procedure: EXCISION MASS;  Surgeon: Wynonia Sours, MD;  Location: [facility];  Service: Orthopedics;  Laterality: Left;  left thumb and left index finger  . MULTIPLE TOOTH EXTRACTIONS  2/12  . ROTATOR CUFF REPAIR     right 09/2017 tear repair    Family History  Problem Relation Age of Onset  . Heart disease Father   . Glaucoma Father   . Thyroid disease Mother   . Hypertension Mother   . Alcohol abuse Mother   . Depression Mother   . Prostate cancer Maternal Grandfather   . Asthma Son   . Learning disabilities Son    Social History   Socioeconomic History  . Marital status: Married    Spouse name: Not on file  . Number of children: 1   . Years of education: BA  . Highest education level: Not on file  Occupational History  . Occupation: [employer]  . Financial resource strain: Not on file  . Food insecurity:    Worry: Not on file    Inability: Not  on file  . Transportation needs:    Medical: Not on file    Non-medical: Not on file  Tobacco Use  . Smoking status: Current Every Day Smoker    Packs/day: 0.50    Types: Cigarettes  . Smokeless tobacco: Never Used  Substance and Sexual Activity  . Alcohol use: Yes    Alcohol/week: 7.0 standard drinks    Types: 7 Standard drinks or equivalent per week    Comment: daily wine  . Drug use: Yes  . Sexual activity: Not on file  Lifestyle  . Physical activity:    Days per week: Not on file    Minutes per session: Not on file  . Stress: Not on file  Relationships  . Social connections:    Talks on phone: Not on file    Gets together: Not on file    Attends religious service: Not on file    Active member of club or  organization: Not on file    Attends meetings of clubs or organizations: Not on file    Relationship status: Not on file  . Intimate partner violence:    Fear of current or ex partner: Not on file    Emotionally abused: Not on file    Physically abused: Not on file    Forced sexual activity: Not on file  Other Topics Concern  . Not on file  Social History Narrative   Drinks about 1 cup of coffee a day, occasionally drinks a soda.    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 ALPRAZolam (XANAX) 0.5 MG tablet Take 0.5 mg by mouth 2 (two) times daily as needed.   Marland Kitchen amLODipine (NORVASC) 5 MG tablet Take 2.5 mg by mouth daily.   Marland Kitchen CALCIUM-VITAMIN D PO Take by mouth 2 (two) times daily.  . misoprostol (CYTOTEC) 200 MCG tablet Take 200 mcg by mouth 2 (two) times daily.   . Multiple Vitamin (MULTI-VITAMINS) TABS Take by mouth.  . pantoprazole (PROTONIX) 40 MG tablet Take 40 mg by mouth 2 (two) times daily before a meal.   . sucralfate (CARAFATE) 1 g tablet Take 1 tablet (1 g total) by mouth 4 (four) times daily.  . traZODone (DESYREL) 100 MG tablet Take 100 mg by mouth at bedtime.   . varenicline (CHANTIX) 1 MG tablet Take 1 tablet (1 mg total) by mouth 2 (two) times daily.  . Varenicline Tartrate (CHANTIX PO) Chantix Starting Month Box 0.5 mg (11)-1 mg (42) tablets in dose pack  . VIIBRYD 10 MG TABS 10 mg daily.   . [DISCONTINUED] cloNIDine (CATAPRES) 0.1 MG tablet TAKE 1 TABLET BY MOUTH EVERYDAY AT BEDTIME  . [DISCONTINUED] varenicline (CHANTIX) 1 MG tablet Chantix Continuing Month Box 1 mg tablet   Current Facility-Administered Medications for the 12/17/17 encounter (Office Visit) with McLean-Scocuzza, [name], MD  Medication  . betamethasone acetate-betamethasone sodium phosphate (CELESTONE) injection 3 mg   Allergies  Allergen Reactions  . Cyclinex [Tetracycline Hcl] Shortness Of  Breath  . Demeclocycline Shortness Of Breath  . Doxycycline Shortness Of Breath  . Minocycline Shortness Of Breath  . Tetracycline Shortness Of Breath  . Tetracyclines & Related Shortness Of Breath   Recent Results (from the past 2160 hour(s))  Lipase, blood     Status: None   Collection Time: 11/10/17  8:23 PM  Result Value Ref Range   Lipase 23 11 - 51 U/L    Comment: Performed at [facility], [address] [place], [place] [address]  Comprehensive metabolic panel     Status: Abnormal   Collection Time: 11/10/17  8:23 PM  Result Value Ref Range   Sodium 141 135 - 145 mmol/L   Potassium 3.7 3.5 - 5.1 mmol/L   Chloride 105 98 - 111 mmol/L   CO2 28 22 - 32 mmol/L   Glucose, Bld 101 (H) 70 - 99 mg/dL   BUN 9 6 - 20 mg/dL   Creatinine, Ser 0.92 0.44 - 1.00 mg/dL   Calcium 8.8 (L) 8.9 - 10.3 mg/dL   Total Protein 7.3 6.5 - 8.1 g/dL   Albumin 4.0 3.5 - 5.0 g/dL   AST 14 (L) 15 - 41 U/L   ALT 9 0 - 44 U/L   Alkaline Phosphatase 89 38 - 126 U/L   Total Bilirubin 0.7 0.3 - 1.2 mg/dL   GFR calc non Af Amer >60 >60 mL/min   GFR calc Af Amer >60 >60 mL/min    Comment: (NOTE) The eGFR has been calculated using the CKD EPI equation. This calculation has not been validated in all clinical situations. eGFR's persistently <60 mL/min signify possible Chronic Kidney Disease.    Anion gap 8 5 - 15    Comment: Performed at [facility], [address] [place], [place] [address]  CBC     Status: None   Collection Time: 11/10/17  8:23 PM  Result Value Ref Range   WBC 6.7 3.6 - 11.0 K/uL   RBC 4.13 3.80 - 5.20 MIL/uL   Hemoglobin 12.2 12.0 - 16.0 g/dL   HCT 36.5 35.0 - 47.0 %   MCV 88.5 80.0 - 100.0 fL   MCH 29.7 26.0 - 34.0 pg   MCHC 33.5 32.0 - 36.0 g/dL   RDW 13.2 11.5 - 14.5 %   Platelets 234 150 - 440 K/uL    Comment: Performed at [facility], [address] [place], [place] [address]  Urinalysis, Complete w Microscopic     Status: Abnormal    Collection Time: 11/10/17  8:23 PM  Result Value Ref Range   Color, Urine YELLOW (A) YELLOW   APPearance CLOUDY (A) CLEAR   Specific Gravity, Urine 1.027 1.005 - 1.030   pH 5.0 5.0 - 8.0   Glucose, UA NEGATIVE NEGATIVE mg/dL   Hgb urine dipstick NEGATIVE NEGATIVE   Bilirubin Urine NEGATIVE NEGATIVE   Ketones, ur NEGATIVE NEGATIVE mg/dL   Protein, ur NEGATIVE NEGATIVE mg/dL   Nitrite NEGATIVE NEGATIVE   Leukocytes, UA TRACE (A) NEGATIVE   RBC / HPF >50 (H) 0 - 5 RBC/hpf   WBC, UA 6-10 0 - 5 WBC/hpf   Bacteria, UA NONE SEEN NONE SEEN   Squamous Epithelial / LPF 11-20 0 - 5   Mucus PRESENT    Ca Oxalate Crys, UA PRESENT     Comment: Performed at [facility], [address] [place], [place] [address]  Troponin I     Status: None   Collection Time: 11/10/17  8:23 PM  Result Value Ref Range   Troponin I <0.03 <0.03 ng/mL    Comment: Performed at [facility], [address] [place], [place] [address]  Pregnancy, urine POC     Status: None   Collection Time: 11/10/17  8:26 PM  Result Value Ref Range   Preg Test,  Ur NEGATIVE NEGATIVE    Comment:        THE SENSITIVITY OF THIS METHODOLOGY IS >24 mIU/mL   FSH     Status: None   Collection Time: 12/17/17  9:54 AM  Result Value Ref Range   FSH 108.8 mIU/ML    Comment: Female Reference Range:  1.4-18.1 mIU/mLFemale Reference Range:Follicular Phase          2.5-10.2 mIU/mLMidCycle Peak          3.4-33.4 mIU/mLLuteal Phase          1.5-9.1 mIU/mLPost Menopausal     23.0-116.3 mIU/mLPregnant          <0.3 mIU/mL  Urinalysis, Routine w reflex microscopic     Status: Abnormal   Collection Time: 12/17/17  9:54 AM  Result Value Ref Range   Color, Urine DARK YELLOW YELLOW   APPearance CLOUDY (A) CLEAR   Specific Gravity, Urine 1.025 1.001 - 1.03   pH 6.0 5.0 - 8.0   Glucose, UA NEGATIVE NEGATIVE   Bilirubin Urine NEGATIVE NEGATIVE   Ketones, ur NEGATIVE NEGATIVE   Hgb urine dipstick NEGATIVE NEGATIVE   Protein, ur  NEGATIVE NEGATIVE   Nitrite NEGATIVE NEGATIVE   Leukocytes, UA TRACE (A) NEGATIVE   WBC, UA 20-40 (A) 0 - 5 /HPF   RBC / HPF 0-2 0 - 2 /HPF   Squamous Epithelial / LPF 6-10 (A) < OR = 5 /HPF   Bacteria, UA FEW (A) NONE SEEN /HPF   Calcium Oxalate Crystal FEW NONE OR FE /HPF   Hyaline Cast NONE SEEN NONE SEEN /LPF  Measles/Mumps/Rubella Immunity     Status: Abnormal   Collection Time: 12/17/17  9:54 AM  Result Value Ref Range   Rubeola IgG <25.00 (L) AU/mL    Comment: AU/mL            Interpretation -----            -------------- <25.00           Negative 25.00-29.99      Equivocal >29.99           Positive . A positive result indicates that the patient has antibody to measles virus. It does not differentiate  between an active or past infection. The clinical  diagnosis must be interpreted in conjunction with  clinical signs and symptoms of the patient.    Mumps IgG 160.00 AU/mL    Comment:  AU/mL           Interpretation -------         ---------------- <9.00             Negative 9.00-10.99        Equivocal >10.99            Positive A positive result indicates that the patient has  antibody to mumps virus. It does not differentiate between an  active or past infection. The clinical diagnosis must be interpreted in conjunction with clinical signs and symptoms of the patient. .    Rubella 17.40 index    Comment:     Index            Interpretation     -----            --------------       <0.90            Not consistent with Immunity     0.90-0.99        Equivocal     >  or = 1.00      Consistent with Immunity  . The presence of rubella IgG antibody suggests  immunization or past or current infection with rubella virus.    Objective  Body mass index is 33.51 kg/m. Wt Readings from Last 3 Encounters:  12/17/17 195 lb 3.2 oz (88.5 kg)  11/10/17 190 lb (86.2 kg)  05/27/17 194 lb (88 kg)   Temp Readings from Last 3 Encounters:  12/17/17 98.6 F (37 C) (Oral)   11/10/17 98.5 F (36.9 C) (Oral)  05/27/17 98.6 F (37 C) (Oral)   BP Readings from Last 3 Encounters:  12/17/17 (!) 130/94  11/10/17 (!) 140/103  05/27/17 (!) 144/65   Pulse Readings from Last 3 Encounters:  12/17/17 70  11/10/17 64  05/27/17 66    Physical Exam  Constitutional: She is oriented to person, place, and time. She appears well-developed and well-nourished. She is cooperative.  HENT:  Head: Normocephalic and atraumatic.  Mouth/Throat: Oropharynx is clear and moist and mucous membranes are normal.  Eyes: Pupils are equal, round, and reactive to light. Conjunctivae are normal.  Cardiovascular: Normal rate, regular rhythm and normal heart sounds.  Pulmonary/Chest: Effort normal and breath sounds normal.  Neurological: She is alert and oriented to person, place, and time. Gait normal.  Skin: Skin is warm, dry and intact.  Psychiatric: She has a normal mood and affect. Her speech is normal and behavior is normal. Judgment and thought content normal. Cognition and memory are normal.  Nursing note and vitals reviewed.   Assessment   1. Anxiety, insomnia  2. GI ulcer 3. HTN uncontrolled  4. Chronic migraines/ha 5. Hot flashes LMP 07/2017  6. H/o goiter thyroid US 09/12/15 b/l thyroid nodules none meet criteria bx of f/u thyroiditis on [place] 7. H/o allergies 8. Chronic sinus issues  9. Tobacco abuse  10. HM  Plan   1. F/u CBC anxiety and sleep not controlled  Pt wants to stop clonidine due to rebound HTN side effects will stop today given to her by psych 2. Cont meds  F/u GI Dr. Marius Ditch on cytotec 200 mcg  Consider increase norvasc to 5 mg qd  3. Consider increase norvasc to 5 mg qd  4. MRI, trial of imitrex 50 prn avoid nsaids 2/2 gastric by pass  MRI denied referred to [facility] 5. Check FSH  6. Refer endocrine f/u  7. Refer allergy was on allergy shots  8. zpack and diflucan add singulair  9. Filled chantix  10.  Declines flu shot  Tdap ?date check OB/GYN records  Dr. Raphael Gibney  Declines HIV check  Check MMR and UA today   Reviewed labs 11/16/17 see HPI   Pap records Dr. Mervyn Gay h/o HPV/abnormal pap  -09/11/17 neg pap no HPV  Mammogram OB/GYN had 09/11/17 [facility] negative  Colonoscopy due age 28 y.o per pt pending with GI   Ortho Dr. Malena Catholic ortho  Dr. Emelda Brothers Bariatric surgery   [facility] wears glasses FH glaucoma being monitored for elevated pressure in her eye not yet dx'ed glaucoma   Obtained records ob/GYN above [facility] cervicx 04/1999  Colp 03/06/98-09/20/97  H/o HSV Provider: Dr. [name] McLean-Scocuzza-Internal Medicine

## 2017-12-21 NOTE — Telephone Encounter (Signed)
Please advise 

## 2017-12-21 NOTE — Telephone Encounter (Signed)
Copied from [place] [id number]. Topic: Referral - Request >> Dec 21, 2017  2:59 PM Antonieta Iba C wrote: Reason for CRM: pt is requesting a referral to [facility]   Fax: [phone] -ATTN : Dr. Barb Merino

## 2017-12-22 ENCOUNTER — Other Ambulatory Visit: Payer: Self-pay | Admitting: Internal Medicine

## 2017-12-22 ENCOUNTER — Encounter: Payer: Self-pay | Admitting: Internal Medicine

## 2017-12-22 DIAGNOSIS — G4700 Insomnia, unspecified: Secondary | ICD-10-CM | POA: Insufficient documentation

## 2017-12-22 DIAGNOSIS — K259 Gastric ulcer, unspecified as acute or chronic, without hemorrhage or perforation: Secondary | ICD-10-CM

## 2017-12-28 NOTE — Progress Notes (Signed)
No records in [facility] vaccines   tMS

## 2017-12-29 ENCOUNTER — Other Ambulatory Visit: Payer: Self-pay | Admitting: Internal Medicine

## 2017-12-29 DIAGNOSIS — G43909 Migraine, unspecified, not intractable, without status migrainosus: Secondary | ICD-10-CM

## 2017-12-29 NOTE — Telephone Encounter (Signed)
Dr. [name], she is willing to go to neurology. Thx, melissa

## 2017-12-31 ENCOUNTER — Ambulatory Visit (INDEPENDENT_AMBULATORY_CARE_PROVIDER_SITE_OTHER): Payer: Managed Care, Other (non HMO) | Admitting: Gastroenterology

## 2017-12-31 ENCOUNTER — Encounter: Payer: Self-pay | Admitting: Gastroenterology

## 2017-12-31 ENCOUNTER — Other Ambulatory Visit: Payer: Self-pay

## 2017-12-31 VITALS — BP 114/88 | HR 112 | Resp 18 | Ht 64.0 in | Wt 194.0 lb

## 2017-12-31 DIAGNOSIS — R1013 Epigastric pain: Secondary | ICD-10-CM | POA: Diagnosis not present

## 2017-12-31 DIAGNOSIS — G8929 Other chronic pain: Secondary | ICD-10-CM

## 2017-12-31 DIAGNOSIS — Z1211 Encounter for screening for malignant neoplasm of colon: Secondary | ICD-10-CM

## 2017-12-31 MED ORDER — OMEPRAZOLE 40 MG PO CPDR: 40.0000 mg | DELAYED_RELEASE_CAPSULE | Freq: Every day | ORAL | 0 refills | Status: DC

## 2017-12-31 NOTE — Progress Notes (Signed)
Open  Cephas Darby, MD [address]  [place]  [place], [place] [address]  Main: [phone]  Fax: [phone]    Gastroenterology Consultation  Referring Provider:     McLean-Scocuzza, [name] * Primary Care Physician:  McLean-Scocuzza, [name], MD Primary Gastroenterologist:  Dr. Cephas Darby Reason for Consultation:     Colon cancer screening, chronic epigastric pain        HPI:   Adrienne Savage is a 47 y.o. female referred by Dr. Terese Door, [name], MD  for consultation & management of colon cancer screening and chronic epigastric pain.  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  She lost about 64 pounds since then.  She reports that, since surgery she has been experiencing chronic epigastric pain.  Underwent 2 upper endoscopies by her bariatric surgeon.  She was told that she had anastomotic ulcer.  She has been taking Protonix 40 mg daily in addition to Pepcid and sucralfate.  She went to ER in 10/2017 secondary to left upper quadrant discomfort, underwent CT which revealed possible gastric thickening and soft tissue edema.  She has been treated as possible gastritis by her bariatric surgeon.  And scheduled for upper endoscopy next week.  Patient denies any other upper GI symptoms.  She recently quit smoking and on Chantix.  She is also due for colonoscopy for colon cancer screening.  She denies any lower GI symptoms.  NSAIDs: None  Antiplts/Anticoagulants/Anti thrombotics: None  GI Procedures: Upper endoscopy in 06/2017, report not available  [facility]oscopy at [facility] on 07/18/2016, report not available Pathology A. 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 No Helicobacter pylori organisms are seen on routine H&E stain. No intestinal metaplasia, dysplasia, or carcinoma is seen.  Past Medical History:  Diagnosis Date  . Allergy   . Anxiety   .  Depression   . Eczema   . Fatigue   . Frequent headaches   . Gastric ulcer   . GERD (gastroesophageal reflux disease)   . Goiter   . History of chicken pox   . HPV in female   . Hypertension   . Insomnia   . Leg pain   . Snoring   . UTI (urinary tract infection)     Past Surgical History:  Procedure Laterality Date  . CESAREAN SECTION  2008  . CYSTECTOMY     cyst on lip  . GASTRIC BYPASS     8/18  . MASS EXCISION  06/23/2011   Procedure: EXCISION MASS;  Surgeon: Wynonia Sours, MD;  Location: [facility];  Service: Orthopedics;  Laterality: Left;  left thumb and left index finger  . MULTIPLE TOOTH EXTRACTIONS  2/12  . ROTATOR CUFF REPAIR     right 09/2017 tear repair     Current Outpatient Medications:  .  ALPRAZolam (XANAX) 0.5 MG tablet, Take 0.5 mg by mouth 2 (two) times daily as needed. , Disp: , Rfl:  .  amLODipine (NORVASC) 5 MG tablet, Take 2.5 mg by mouth daily. , Disp: , Rfl:  .  CALCIUM-VITAMIN D PO, Take by mouth 2 (two) times daily., Disp: , Rfl:  .  LORazepam (ATIVAN) 1 MG tablet, PLEASE SEE ATTACHED FOR DETAILED DIRECTIONS, Disp: , Rfl: 0 .  misoprostol (CYTOTEC) 200 MCG tablet, Take 200 mcg by mouth 2 (two) times daily. , Disp: , Rfl:  .  montelukast (SINGULAIR) 10 MG tablet, Take  1 tablet (10 mg total) by mouth at bedtime., Disp: 90 tablet, Rfl: 3 .  Multiple Vitamin (MULTI-VITAMINS) TABS, Take by mouth., Disp: , Rfl:  .  pantoprazole (PROTONIX) 40 MG tablet, Take 40 mg by mouth 2 (two) times daily before a meal. , Disp: , Rfl:  .  SUMAtriptan (IMITREX) 50 MG tablet, May repeat in 2 hours if headache persists or recurs. Max 4 pills in 24 hours, Disp: 10 tablet, Rfl: 2 .  traZODone (DESYREL) 100 MG tablet, Take 100 mg by mouth at bedtime. , Disp: , Rfl: 0 .  varenicline (CHANTIX) 1 MG tablet, Take 1 tablet (1 mg total) by mouth 2 (two) times daily., Disp: 180 tablet, Rfl: 0 .  Varenicline Tartrate (CHANTIX PO), Chantix Starting Month Box 0.5 mg  (11)-1 mg (42) tablets in dose pack, Disp: , Rfl:  .  VIIBRYD 10 MG TABS, 10 mg daily. , Disp: , Rfl:  .  azithromycin (ZITHROMAX) 250 MG tablet, 2 pills day 1, 1 pill day 2-5 (Patient not taking: Reported on 12/31/2017), Disp: 6 tablet, Rfl: 0 .  famotidine (PEPCID) 20 MG tablet, Take 1 tablet (20 mg total) by mouth 2 (two) times daily for 7 days., Disp: 14 tablet, Rfl: 0 .  omeprazole (PRILOSEC) 40 MG capsule, Take 1 capsule (40 mg total) by mouth daily before breakfast., Disp: 90 capsule, Rfl: 0 .  sucralfate (CARAFATE) 1 g tablet, Take 1 tablet (1 g total) by mouth 4 (four) times daily. (Patient not taking: Reported on 12/31/2017), Disp: 60 tablet, Rfl: 0  Current Facility-Administered Medications:  .  betamethasone acetate-betamethasone sodium phosphate (CELESTONE) injection 3 mg, 3 mg, Intramuscular, Once, Edrick Kins, DPM   Family History  Problem Relation Age of Onset  . Heart disease Father   . Glaucoma Father   . Thyroid disease Mother   . Hypertension Mother   . Alcohol abuse Mother   . Depression Mother   . Prostate cancer Maternal Grandfather   . Asthma Son   . Learning disabilities Son      Social History   Tobacco Use  . Smoking status: Current Every Day Smoker    Packs/day: 0.50    Types: Cigarettes  . Smokeless tobacco: Never Used  Substance Use Topics  . Alcohol use: Yes    Alcohol/week: 7.0 standard drinks    Types: 7 Standard drinks or equivalent per week    Comment: daily wine  . Drug use: Yes    Allergies as of 12/31/2017 - Review Complete 12/31/2017  Allergen Reaction Noted  . Cyclinex [tetracycline hcl] Shortness Of Breath 06/16/2011  . Demeclocycline Shortness Of Breath 06/16/2011  . Doxycycline Shortness Of Breath 06/16/2011  . Minocycline Shortness Of Breath 06/16/2011  . Tetracycline Shortness Of Breath 09/04/2015  . Tetracyclines & related Shortness Of Breath 06/16/2011    Review of Systems:    All systems reviewed and negative except  where noted in HPI.   Physical Exam:  BP 114/88 (BP Location: Left Arm, Patient Position: Sitting, Cuff Size: Large)   Pulse (!) 112   Resp 18   Ht 5\' 4"  (1.626 m)   Wt 194 lb (88 kg)   BMI 33.30 kg/m  No LMP recorded. (Menstrual status: Irregular Periods).  General:   Alert,  Well-developed, well-nourished, pleasant and cooperative in NAD Head:  Normocephalic and atraumatic. Eyes:  Sclera clear, no icterus.   Conjunctiva pink. Ears:  Normal auditory acuity. Nose:  No deformity, discharge, or lesions. Mouth:  No deformity  or lesions,oropharynx pink & moist. Neck:  Supple; no masses or thyromegaly. Lungs:  Respirations even and unlabored.  Clear throughout to auscultation.   No wheezes, crackles, or rhonchi. No acute distress. Heart:  Regular rate and rhythm; no murmurs, clicks, rubs, or gallops. Abdomen:  Normal bowel sounds. Soft, non-tender and non-distended without masses, hepatosplenomegaly or hernias noted.  No guarding or rebound tenderness.   Rectal: Not performed Msk:  Symmetrical without gross deformities. Good, equal movement & strength bilaterally. Pulses:  Normal pulses noted. Extremities:  No clubbing or edema.  No cyanosis. Neurologic:  Alert and oriented x3;  grossly normal neurologically. Skin:  Intact without significant lesions or rashes. No jaundice. Lymph Nodes:  No significant cervical adenopathy. Psych:  Alert and cooperative. Normal mood and affect.  Imaging Studies: Reviewed  Assessment and Plan:   Adrienne Savage is a 47 y.o. African-American female with history of Roux-en-Y gastric bypass in 10/2016 at [facility], chronic epigastric pain and due for colon cancer screening  Chronic epigastric pain: Possible anastomotic ulcer Start omeprazole 40 mg once daily Stop Protonix, Pepcid and sucralfate Obtain recent upper endoscopy report from [facility]  Colon cancer screening: Schedule colonoscopy  I have discussed alternative options, risks & benefits,  which  include, but are not limited to, bleeding, infection, perforation,respiratory complication & drug reaction.  The patient agrees with this plan & written consent will be obtained.     Follow up in 3 months   Cephas Darby, MD

## 2018-01-28 ENCOUNTER — Ambulatory Visit: Payer: Managed Care, Other (non HMO) | Admitting: Internal Medicine

## 2018-01-28 ENCOUNTER — Encounter: Payer: Self-pay | Admitting: General Surgery

## 2018-02-08 ENCOUNTER — Encounter
Admission: RE | Disposition: A | Discharge status: Home / Self Care | Payer: Self-pay | Source: Ambulatory Visit | Attending: Gastroenterology

## 2018-02-08 ENCOUNTER — Ambulatory Visit: Payer: Managed Care, Other (non HMO) | Admitting: Certified Registered Nurse Anesthetist

## 2018-02-08 ENCOUNTER — Other Ambulatory Visit: Payer: Self-pay

## 2018-02-08 ENCOUNTER — Ambulatory Visit
Admission: RE | Admit: 2018-02-08 | Discharge: 2018-02-08 | Disposition: A | Discharge status: Home / Self Care | Payer: Managed Care, Other (non HMO) | Source: Ambulatory Visit | Attending: Gastroenterology | Admitting: Gastroenterology

## 2018-02-08 DIAGNOSIS — G8929 Other chronic pain: Secondary | ICD-10-CM

## 2018-02-08 DIAGNOSIS — F419 Anxiety disorder, unspecified: Secondary | ICD-10-CM | POA: Diagnosis not present

## 2018-02-08 DIAGNOSIS — Z87891 Personal history of nicotine dependence: Secondary | ICD-10-CM | POA: Insufficient documentation

## 2018-02-08 DIAGNOSIS — Z9884 Bariatric surgery status: Secondary | ICD-10-CM | POA: Diagnosis not present

## 2018-02-08 DIAGNOSIS — R1013 Epigastric pain: Secondary | ICD-10-CM | POA: Diagnosis not present

## 2018-02-08 DIAGNOSIS — G4700 Insomnia, unspecified: Secondary | ICD-10-CM | POA: Insufficient documentation

## 2018-02-08 DIAGNOSIS — R1012 Left upper quadrant pain: Secondary | ICD-10-CM | POA: Insufficient documentation

## 2018-02-08 DIAGNOSIS — I10 Essential (primary) hypertension: Secondary | ICD-10-CM | POA: Insufficient documentation

## 2018-02-08 DIAGNOSIS — Z1211 Encounter for screening for malignant neoplasm of colon: Secondary | ICD-10-CM

## 2018-02-08 DIAGNOSIS — Q439 Congenital malformation of intestine, unspecified: Secondary | ICD-10-CM | POA: Insufficient documentation

## 2018-02-08 DIAGNOSIS — K219 Gastro-esophageal reflux disease without esophagitis: Secondary | ICD-10-CM | POA: Insufficient documentation

## 2018-02-08 DIAGNOSIS — Z980 Intestinal bypass and anastomosis status: Secondary | ICD-10-CM | POA: Insufficient documentation

## 2018-02-08 DIAGNOSIS — Z79899 Other long term (current) drug therapy: Secondary | ICD-10-CM | POA: Insufficient documentation

## 2018-02-08 HISTORY — PX: ESOPHAGOGASTRODUODENOSCOPY (EGD) WITH PROPOFOL: SHX5813

## 2018-02-08 HISTORY — PX: COLONOSCOPY WITH PROPOFOL: SHX5780

## 2018-02-08 LAB — POCT PREGNANCY, URINE: Preg Test, Ur: NEGATIVE

## 2018-02-08 SURGERY — ESOPHAGOGASTRODUODENOSCOPY (EGD) WITH PROPOFOL
Anesthesia: General

## 2018-02-08 MED ORDER — PROPOFOL 500 MG/50ML IV EMUL
INTRAVENOUS | Status: DC | PRN
  Administered 2018-02-08: 130 ug/kg/min via INTRAVENOUS

## 2018-02-08 MED ORDER — PROPOFOL 10 MG/ML IV BOLUS
INTRAVENOUS | Status: DC | PRN
  Administered 2018-02-08: 40 mg via INTRAVENOUS
  Administered 2018-02-08: 10 mg via INTRAVENOUS
  Administered 2018-02-08: 50 mg via INTRAVENOUS
  Administered 2018-02-08: 20 mg via INTRAVENOUS

## 2018-02-08 MED ORDER — FENTANYL CITRATE (PF) 100 MCG/2ML IJ SOLN
INTRAMUSCULAR | Status: DC | PRN
  Administered 2018-02-08: 100 ug via INTRAVENOUS

## 2018-02-08 MED ORDER — SODIUM CHLORIDE 0.9 % IV SOLN
INTRAVENOUS | Status: DC | PRN
  Administered 2018-02-08: 09:00:00 via INTRAVENOUS

## 2018-02-08 MED ORDER — PROPOFOL 500 MG/50ML IV EMUL
INTRAVENOUS | Status: AC
  Filled 2018-02-08: qty 100

## 2018-02-08 MED ORDER — LIDOCAINE HCL (CARDIAC) PF 100 MG/5ML IV SOSY
PREFILLED_SYRINGE | INTRAVENOUS | Status: DC | PRN
  Administered 2018-02-08: 100 mg via INTRAVENOUS

## 2018-02-08 MED ORDER — OMEPRAZOLE 40 MG PO CPDR: 40.0000 mg | DELAYED_RELEASE_CAPSULE | Freq: Two times a day (BID) | ORAL | 0 refills | Status: DC

## 2018-02-08 MED ORDER — FENTANYL CITRATE (PF) 100 MCG/2ML IJ SOLN
INTRAMUSCULAR | Status: AC
  Filled 2018-02-08: qty 2

## 2018-02-08 MED ORDER — SODIUM CHLORIDE 0.9 % IV SOLN
INTRAVENOUS | Status: DC
  Administered 2018-02-08: 09:00:00 via INTRAVENOUS

## 2018-02-08 NOTE — Anesthesia Post-op Follow-up Note (Signed)
Anesthesia QCDR form completed.        

## 2018-02-08 NOTE — Op Note (Signed)
[facility] Gastroenterology Patient Name: Adrienne Savage Procedure Date: 02/08/2018 9:05 AM MRN: [id number] Account #: [id number] Date of Birth: 11-Nov-1970 Admit Type: Outpatient Age: 47 Room: [facility] ENDO ROOM 4 Gender: Female Note Status: Finalized Procedure:            Colonoscopy Indications:          Screening for colorectal malignant neoplasm, This is                        the patient's first colonoscopy Providers:            Lin Landsman MD, MD Referring MD:         [name] Mclean-Scocuzza MD, MD (Referring MD) Medicines:            Monitored Anesthesia Care Complications:        No immediate complications. Estimated blood loss: None. Procedure:            Pre-Anesthesia Assessment:                       - Prior to the procedure, a History and Physical was                        performed, and patient medications and allergies were                        reviewed. The patient is competent. The risks and                        benefits of the procedure and the sedation options and                        risks were discussed with the patient. All questions                        were answered and informed consent was obtained.                        Patient identification and proposed procedure were                        verified by the physician, the nurse, the                        anesthesiologist, the anesthetist and the technician in                        the pre-procedure area in the procedure room in the                        endoscopy suite. Mental Status Examination: alert and                        oriented. Airway Examination: normal oropharyngeal                        airway and neck mobility. Respiratory Examination:                        clear to auscultation. CV Examination: normal.  Prophylactic Antibiotics: The patient does not require                        prophylactic antibiotics. Prior Anticoagulants:  The                        patient has taken no previous anticoagulant or                        antiplatelet agents. ASA Grade Assessment: III - A                        patient with severe systemic disease. After reviewing                        the risks and benefits, the patient was deemed in                        satisfactory condition to undergo the procedure. The                        anesthesia plan was to use monitored anesthesia care                        (MAC). Immediately prior to administration of                        medications, the patient was re-assessed for adequacy                        to receive sedatives. The heart rate, respiratory rate,                        oxygen saturations, blood pressure, adequacy of                        pulmonary ventilation, and response to care were                        monitored throughout the procedure. The physical status                        of the patient was re-assessed after the procedure.                       After obtaining informed consent, the colonoscope was                        passed under direct vision. Throughout the procedure,                        the patient's blood pressure, pulse, and oxygen                        saturations were monitored continuously. The                        Colonoscope was introduced through the anus and  advanced to the the cecum, identified by appendiceal                        orifice and ileocecal valve. The colonoscopy was                        performed with moderate difficulty due to significant                        looping, a tortuous colon and the patient's body                        habitus. Successful completion of the procedure was                        aided by changing the patient to a prone position and                        applying abdominal pressure. The patient tolerated the                        procedure well. The quality of the  bowel preparation                        was evaluated using the BBPS [facility] Bowel Preparation                        Scale) with scores of: Right Colon = 3, Transverse                        Colon = 3 and Left Colon = 3 (entire mucosa seen well                        with no residual staining, small fragments of stool or                        opaque liquid). The total BBPS score equals 9. Findings:      The perianal and digital rectal examinations were normal. Pertinent       negatives include normal sphincter tone and no palpable rectal lesions.      The entire examined colon appeared normal.      The retroflexed view of the distal rectum and anal verge was normal and       showed no anal or rectal abnormalities. Impression:           - The entire examined colon is normal.                       - The distal rectum and anal verge are normal on                        retroflexion view.                       - No specimens collected. Recommendation:       - Discharge patient to home (with escort).                       - Resume previous diet today.                       -  Continue present medications.                       - Repeat colonoscopy in 10 years for surveillance.                       - Return to GI office as previously scheduled. Procedure Code(s):    --- Professional ---                       H5456, Colorectal cancer screening; colonoscopy on                        individual not meeting criteria for high risk Diagnosis Code(s):    --- Professional ---                       Z12.11, Encounter for screening for malignant neoplasm                        of colon CPT copyright 2018 American Medical Association. All rights reserved. The codes documented in this report are preliminary and upon coder review may  be revised to meet current compliance requirements. Dr. Ulyess Mort Lin Landsman MD, MD 02/08/2018 9:41:46 AM This report has been signed electronically. Number  of Addenda: 0 Note Initiated On: 02/08/2018 9:05 AM Scope Withdrawal Time: 0 hours 10 minutes 28 seconds  Total Procedure Duration: 0 hours 15 minutes 19 seconds       [facility]

## 2018-02-08 NOTE — Op Note (Signed)
[facility] Gastroenterology Patient Name: Adrienne Savage Procedure Date: 02/08/2018 9:06 AM MRN: [id number] Account #: [id number] Date of Birth: September 10, 1970 Admit Type: Outpatient Age: 47 Room: [facility] ENDO ROOM 4 Gender: Female Note Status: Finalized Procedure:            Upper GI endoscopy Indications:          Epigastric abdominal pain, Abdominal pain in the left                        upper quadrant Providers:            Lin Landsman MD, MD Referring MD:         [name] Mclean-Scocuzza MD, MD (Referring MD) Medicines:            Monitored Anesthesia Care Complications:        No immediate complications. Estimated blood loss: None. Procedure:            Pre-Anesthesia Assessment:                       - Prior to the procedure, a History and Physical was                        performed, and patient medications and allergies were                        reviewed. The patient is competent. The risks and                        benefits of the procedure and the sedation options and                        risks were discussed with the patient. All questions                        were answered and informed consent was obtained.                        Patient identification and proposed procedure were                        verified by the physician, the nurse, the                        anesthesiologist, the anesthetist and the technician in                        the pre-procedure area in the procedure room in the                        endoscopy suite. Mental Status Examination: alert and                        oriented. Airway Examination: normal oropharyngeal                        airway and neck mobility. Respiratory Examination:                        clear to auscultation. CV Examination: normal.  Prophylactic Antibiotics: The patient does not require                        prophylactic antibiotics. Prior Anticoagulants: The                     patient has taken no previous anticoagulant or                        antiplatelet agents. ASA Grade Assessment: III - A                        patient with severe systemic disease. After reviewing                        the risks and benefits, the patient was deemed in                        satisfactory condition to undergo the procedure. The                        anesthesia plan was to use monitored anesthesia care                        (MAC). Immediately prior to administration of                        medications, the patient was re-assessed for adequacy                        to receive sedatives. The heart rate, respiratory rate,                        oxygen saturations, blood pressure, adequacy of                        pulmonary ventilation, and response to care were                        monitored throughout the procedure. The physical status                        of the patient was re-assessed after the procedure.                       After obtaining informed consent, the endoscope was                        passed under direct vision. Throughout the procedure,                        the patient's blood pressure, pulse, and oxygen                        saturations were monitored continuously. The Endoscope                        was introduced through the mouth, and advanced to the                        afferent  and efferent jejunal loops. The upper GI                        endoscopy was accomplished without difficulty. The                        patient tolerated the procedure well. Findings:      Evidence of a Roux-en-Y gastrojejunostomy was found. The gastrojejunal       anastomosis was characterized by a solitary clean based ulcer. This was       traversed. The pouch-to-jejunum limb was characterized by normal mucosa.       The jejunojejunal anastomosis was characterized by healthy appearing       mucosa. The duodenum-to-jejunum limb was not  examined as it could not be       traversed.      The gastric body was normal.      The gastroesophageal junction and examined esophagus were normal. Impression:           - Roux-en-Y gastrojejunostomy with gastrojejunal                        anastomosis characterized by ulceration.                       - Normal gastric body.                       - Normal gastroesophageal junction and esophagus.                       - No specimens collected. Recommendation:       - Use Prilosec (omeprazole) 40 mg PO BID for 3 months.                       - Proceed with colonoscopy as scheduled                       See colonoscopy report Procedure Code(s):    --- Professional ---                       [phone], Esophagogastroduodenoscopy, flexible, transoral;                        diagnostic, including collection of specimen(s) by                        brushing or washing, when performed (separate procedure) Diagnosis Code(s):    --- Professional ---                       Z98.0, Intestinal bypass and anastomosis status                       R10.13, Epigastric pain                       R10.12, Left upper quadrant pain CPT copyright 2018 American Medical Association. All rights reserved. The codes documented in this report are preliminary and upon coder review may  be revised to meet current compliance requirements. Dr. Ulyess Mort Lin Landsman MD, MD 02/08/2018 9:23:00 AM This report has been signed electronically. Number of Addenda: 0  Note Initiated On: 02/08/2018 9:06 AM      [facility]

## 2018-02-08 NOTE — Anesthesia Preprocedure Evaluation (Signed)
Anesthesia Evaluation  Patient identified by MRN, date of birth, ID band Patient awake    Reviewed: Allergy & Precautions, NPO status , Patient's Chart, lab work & pertinent test results  History of Anesthesia Complications Negative for: history of anesthetic complications  Airway Mallampati: II       Dental   Pulmonary neg sleep apnea, neg COPD,           Cardiovascular hypertension, Pt. on medications (-) Past MI and (-) CHF (-) dysrhythmias (-) Valvular Problems/Murmurs     Neuro/Psych neg Seizures Anxiety Depression    GI/Hepatic Neg liver ROS, PUD, GERD  Medicated,  Endo/Other  neg diabetesBorderline goiter  Renal/GU negative Renal ROS     Musculoskeletal   Abdominal   Peds  Hematology  (+) anemia ,   Anesthesia Other Findings   Reproductive/Obstetrics                             Anesthesia Physical Anesthesia Plan  ASA: III  Anesthesia Plan: General   Post-op Pain Management:    Induction:   PONV Risk Score and Plan: 3 and Propofol infusion, TIVA and Midazolam  Airway Management Planned: Nasal Cannula  Additional Equipment:   Intra-op Plan:   Post-operative Plan:   Informed Consent: I have reviewed the patients History and Physical, chart, labs and discussed the procedure including the risks, benefits and alternatives for the proposed anesthesia with the patient or authorized representative who has indicated his/her understanding and acceptance.     Plan Discussed with:   Anesthesia Plan Comments:         Anesthesia Quick Evaluation

## 2018-02-08 NOTE — Anesthesia Postprocedure Evaluation (Signed)
Anesthesia Post Note  Patient: Adrienne Savage  Procedure(s) Performed: ESOPHAGOGASTRODUODENOSCOPY (EGD) WITH PROPOFOL (N/A ) COLONOSCOPY WITH PROPOFOL (N/A )  Patient location during evaluation: Endoscopy Anesthesia Type: General Level of consciousness: awake and alert Pain management: pain level controlled Vital Signs Assessment: post-procedure vital signs reviewed and stable Respiratory status: spontaneous breathing and respiratory function stable Cardiovascular status: stable Anesthetic complications: no     Last Vitals:  Vitals:   02/08/18 0853 02/08/18 0944  BP: (!) 151/98 133/86  Pulse: (!) 56 72  Resp: 18 17  Temp: (!) 36.2 C (!) 36.1 C  SpO2: 100% 100%    Last Pain:  Vitals:   02/08/18 0944  TempSrc: Tympanic  PainSc: 0-No pain                 KEPHART,WILLIAM K

## 2018-02-08 NOTE — H&P (Signed)
Cephas Darby, MD [address]  [place]  [place], [place] [address]  Main: [phone]  Fax: [phone] Pager: [phone]  Primary Care Physician:  McLean-Scocuzza, [name], MD Primary Gastroenterologist:  Dr. Cephas Darby  Pre-Procedure History & Physical: HPI:  Adrienne Savage is a 47 y.o. female is here for an endoscopy and colonoscopy.   Past Medical History:  Diagnosis Date  . Allergy   . Anxiety   . Depression   . Eczema   . Fatigue   . Frequent headaches   . Gastric ulcer   . GERD (gastroesophageal reflux disease)   . Goiter   . History of chicken pox   . HPV in female   . Hypertension   . Insomnia   . Leg pain   . Snoring   . UTI (urinary tract infection)     Past Surgical History:  Procedure Laterality Date  . CESAREAN SECTION  2008  . CYSTECTOMY     cyst on lip  . GASTRIC BYPASS     8/18  . MASS EXCISION  06/23/2011   Procedure: EXCISION MASS;  Surgeon: Wynonia Sours, MD;  Location: [facility];  Service: Orthopedics;  Laterality: Left;  left thumb and left index finger  . MULTIPLE TOOTH EXTRACTIONS  2/12  . ROTATOR CUFF REPAIR     right 09/2017 tear repair     Prior to Admission medications   Medication Sig Start Date End Date Taking? Authorizing Provider  ALPRAZolam Duanne Moron) 0.5 MG tablet Take 0.5 mg by mouth 2 (two) times daily as needed.  12/07/17   [provider]  amLODipine (NORVASC) 5 MG tablet Take 2.5 mg by mouth daily.  10/12/14   [provider]  azithromycin (ZITHROMAX) 250 MG tablet 2 pills day 1, 1 pill day 2-5 Patient not taking: Reported on 12/31/2017 12/17/17   McLean-Scocuzza, [name], MD  CALCIUM-VITAMIN D PO Take by mouth 2 (two) times daily.    [provider]  famotidine (PEPCID) 20 MG tablet Take 1 tablet (20 mg total) by mouth 2 (two) times daily for 7 days. 05/27/17 06/03/17  Arta Silence, MD  LORazepam (ATIVAN) 1 MG tablet PLEASE SEE ATTACHED FOR DETAILED DIRECTIONS  12/18/17   [provider]  misoprostol (CYTOTEC) 200 MCG tablet Take 200 mcg by mouth 2 (two) times daily.  11/19/17 02/17/18  [provider]  montelukast (SINGULAIR) 10 MG tablet Take 1 tablet (10 mg total) by mouth at bedtime. 12/17/17   McLean-Scocuzza, [name], MD  Multiple Vitamin (MULTI-VITAMINS) TABS Take by mouth.    [provider]  omeprazole (PRILOSEC) 40 MG capsule Take 1 capsule (40 mg total) by mouth daily before breakfast. 12/31/17 03/31/18  Lin Landsman, MD  pantoprazole (PROTONIX) 40 MG tablet Take 40 mg by mouth 2 (two) times daily before a meal.  11/19/17 02/17/18  [provider]  sucralfate (CARAFATE) 1 g tablet Take 1 tablet (1 g total) by mouth 4 (four) times daily. Patient not taking: Reported on 12/31/2017 11/10/17   Nance Pear, MD  SUMAtriptan [facility]) 50 MG tablet May repeat in 2 hours if headache persists or recurs. Max 4 pills in 24 hours 12/17/17   McLean-Scocuzza, [name], MD  traZODone (DESYREL) 100 MG tablet Take 100 mg by mouth at bedtime.  10/20/17   [provider]  varenicline (CHANTIX) 1 MG tablet Take 1 tablet (1 mg total) by mouth 2 (two) times daily. 12/17/17   McLean-Scocuzza,  [name], MD  Varenicline Tartrate (CHANTIX PO) Chantix Starting Month Box 0.5 mg (11)-1 mg (42) tablets in dose pack    [provider]  VIIBRYD 10 MG TABS 10 mg daily.  09/22/14   [provider]    Allergies as of 12/31/2017 - Review Complete 12/31/2017  Allergen Reaction Noted  . Cyclinex [tetracycline hcl] Shortness Of Breath 06/16/2011  . Demeclocycline Shortness Of Breath 06/16/2011  . Doxycycline Shortness Of Breath 06/16/2011  . Minocycline Shortness Of Breath 06/16/2011  . Tetracycline Shortness Of Breath 09/04/2015  . Tetracyclines & related Shortness Of Breath 06/16/2011    Family History  Problem Relation Age of Onset  . Heart disease Father   . Glaucoma Father   . Thyroid disease Mother   .  Hypertension Mother   . Alcohol abuse Mother   . Depression Mother   . Prostate cancer Maternal Grandfather   . Asthma Son   . Learning disabilities Son     Social History   Socioeconomic History  . Marital status: Married    Spouse name: Not on file  . Number of children: 1   . Years of education: BA  . Highest education level: Not on file  Occupational History  . Occupation: [employer]  . Financial resource strain: Not on file  . Food insecurity:    Worry: Not on file    Inability: Not on file  . Transportation needs:    Medical: Not on file    Non-medical: Not on file  Tobacco Use  . Smoking status: Former Smoker    Packs/day: 0.50    Types: Cigarettes    Last attempt to quit: 04/2015    Years since quitting: 2.8  . Smokeless tobacco: Never Used  Substance and Sexual Activity  . Alcohol use: Yes    Alcohol/week: 7.0 standard drinks    Types: 7 Standard drinks or equivalent per week    Comment: daily wine  . Drug use: Not Currently  . Sexual activity: Not on file  Lifestyle  . Physical activity:    Days per week: Not on file    Minutes per session: Not on file  . Stress: Not on file  Relationships  . Social connections:    Talks on phone: Not on file    Gets together: Not on file    Attends religious service: Not on file    Active member of club or organization: Not on file    Attends meetings of clubs or organizations: Not on file    Relationship status: Not on file  . Intimate partner violence:    Fear of current or ex partner: Not on file    Emotionally abused: Not on file    Physically abused: Not on file    Forced sexual activity: Not on file  Other Topics Concern  . Not on file  Social History Narrative   Drinks about 1 cup of coffee a day, occasionally drinks a soda.    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 Neck:  Supple; no masses or thyromegaly. Lungs:  Clear throughout to auscultation.    Heart:  Regular rate and rhythm. Abdomen:  Soft, nontender and nondistended. Normal bowel sounds, without guarding, and without rebound.   Neurologic:  Alert and  oriented x4;  grossly normal neurologically.  Impression/Plan: Adrienne Savage is here for an endoscopy and colonoscopy to be performed for Chronic epigastric pain, colon cancer screening  Risks, benefits, limitations, and alternatives regarding  endoscopy and colonoscopy have been reviewed with the patient.  Questions have been answered.  All parties agreeable.   Sherri Sear, MD  02/08/2018, 9:08 AM

## 2018-02-08 NOTE — Transfer of Care (Signed)
Immediate Anesthesia Transfer of Care Note  Patient: Adrienne Savage  Procedure(s) Performed: ESOPHAGOGASTRODUODENOSCOPY (EGD) WITH PROPOFOL (N/A ) COLONOSCOPY WITH PROPOFOL (N/A )  Patient Location: PACU  Anesthesia Type:General  Level of Consciousness: awake and alert   Airway & Oxygen Therapy: Patient Spontanous Breathing  Post-op Assessment: Report given to RN and Post -op Vital signs reviewed and stable  Post vital signs: Reviewed and stable  Last Vitals:  Vitals Value Taken Time  BP    Temp    Pulse 71 02/08/2018  9:44 AM  Resp 20 02/08/2018  9:44 AM  SpO2 100 % 02/08/2018  9:44 AM  Vitals shown include unvalidated device data.  Last Pain:  Vitals:   02/08/18 0853  TempSrc: Tympanic  PainSc: 9          Complications: No apparent anesthesia complications

## 2018-02-09 ENCOUNTER — Encounter: Payer: Self-pay | Admitting: Gastroenterology

## 2018-02-09 ENCOUNTER — Encounter: Payer: Self-pay | Admitting: General Surgery

## 2018-02-24 ENCOUNTER — Ambulatory Visit: Payer: Managed Care, Other (non HMO) | Admitting: Neurology

## 2018-02-24 ENCOUNTER — Encounter: Payer: Self-pay | Admitting: Neurology

## 2018-02-24 VITALS — BP 150/105 | HR 71 | Ht 64.0 in | Wt 193.0 lb

## 2018-02-24 DIAGNOSIS — R51 Headache: Secondary | ICD-10-CM

## 2018-02-24 DIAGNOSIS — R0683 Snoring: Secondary | ICD-10-CM | POA: Diagnosis not present

## 2018-02-24 DIAGNOSIS — G4489 Other headache syndrome: Secondary | ICD-10-CM | POA: Diagnosis not present

## 2018-02-24 DIAGNOSIS — G43009 Migraine without aura, not intractable, without status migrainosus: Secondary | ICD-10-CM

## 2018-02-24 DIAGNOSIS — Z9884 Bariatric surgery status: Secondary | ICD-10-CM

## 2018-02-24 DIAGNOSIS — G478 Other sleep disorders: Secondary | ICD-10-CM

## 2018-02-24 DIAGNOSIS — R519 Headache, unspecified: Secondary | ICD-10-CM

## 2018-02-24 NOTE — Progress Notes (Signed)
Subjective:    Patient ID: Adrienne Savage is a 47 y.o. female.  HPI     Star Age, MD, PhD [facility] [address], [address] [place], [place] [address]  Dear [name],    I saw your patient, Adrienne Savage, upon your kind request in my neurologic clinic today for initial consultation of her sleep disorder, in particular, concern for underlying obstructive sleep apnea. The patient is unaccompanied today. As you know, [name] is a 47 year old right-handed woman with an underlying medical history of allergies, asthma, anxiety, depression, eczema, reflux disease, hypertension, prior smoking, obesity with status post gastric bypass surgery, who reports recurrent headaches for the past several years, worse in the past 3 years. I reviewed your office note from 12/17/2017. I have previously seen her over 3 years ago for sleep evaluation, she did not pursue a sleep study at the time. She is married and lives with her husband and her son. She quit smoking in 2018, drinks alcohol occasionally in the form of wine, drinks caffeine in the form of coffee, one cup per day on average. She has been on sumatriptan as needed. She has been post-menopausal. She reports that headache frequency seems to be more on the weekends and the beginning of the month. She is not sure why. She reports appearing orbital pain at times, sometimes it starts to one side and goes to the other. Sumatriptan has been somewhat helpful. You ordered a Brain MRI. Of note, she is on Chantix off and on, she has noted headaches after taking Chantix. She has had some relapses with her smoking has had the occasional cigarette. She is in follow-up by her bariatric program at [facility]. She admits that she does not hydrate as well as before her gastric bypass surgery, which was in August 2018. She feels that she has had some taste changes and gets full easier. She does not sleep very well. She has difficulty maintaining  sleep. She tries not to take trazodone as she has not felt well after taking it. She believes that she has a migrainous headache about once a month, she has taken Imitrex one pill for that typically, she has associated nausea, typically no vomiting, she gets sensitivity to light and smell. She has other more frequent headaches that are more generalized and not typically associated with nausea or light sensitivity. She has woken up with a headache. She does not have night to night nocturia. She has no family history of OSA, she would be willing to pursue a sleep testing. She reports that her MRI was denied. Her Epworth sleepiness score is 8 out of 24 today, fatigue score is 53 out of 63.  Previously:   10/26/2014: 47 year old right-handed woman with an underlying medical history of hypertension, impaired fasting glucose, depression, smoking, anxiety disorder, obesity, history of syncope, recurrent headaches and knee pain, who reports snoring and excessive daytime somnolence. She had a sleep study on 09/22/2011, interpreted by Dr. Baird Lyons: Sleep efficiency was 83.6%. AHI was 0.4 per hour. REM AHI was 1.2 per hour. She was noted to have moderate snoring, oxygen desaturation nadir was 86%. Her BMI was noted to be 36.9 at the time. She has gained weight, currently at 223 lb. She had gained more weight, then lost some, down to 190 lb, then gained back. Her biggist complaint about her sleep is EDS, her ESS is 16/24 today, fatigue score is 58/63. She endorses rare morning headaches, about once a month  or so. She does not have any nocturia. She drinks very little caffeine, maybe one cup of coffee per day, rare sodas. She drinks alcohol rarely. She smokes half a pack per day. She has not recently decreased her smoking. She endorses restless leg symptoms which are not every day but do bother her at times. She does feel like she has to move her legs in her bed and is a restless sleeper. She primarily sleeps on her  sides. She snores, as reported by her family. She worries about her goiter. She states, was supposed to have an ultrasound of her neck last year but this never came to fruition for some reason. She had recent blood work in your office which per her report was fine. She has had some recent irregularities with her menstrual period. I reviewed your office note from 08/25/2014 which you kindly included. She reports a suspected diagnosis of OSA in her mother. Her mom also has symptoms of restless legs. She goes to bed usually by 9 PM because she feels tired but has difficulty falling asleep. She has been on Ambien 5 mg for years. She may have been on Ambien for the past 10 years. She usually falls asleep around midnight or sometimes as late as 1 AM. She spontaneously wakes up around 5 AM. Her rise time for work is not until 7 AM. 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  She has occasional nighttime postnasal drip. She denies any significant reflux issues at night.  Her Past Medical History Is Significant For: Past Medical History:  Diagnosis Date  . Allergy   . Anxiety   . Depression   . Eczema   . Fatigue   . Frequent headaches   . Gastric ulcer   . GERD (gastroesophageal reflux disease)   . Goiter   . History of chicken pox   . HPV in female   . Hypertension   . Insomnia   . Leg pain   . Snoring   . UTI (urinary tract infection)     Her Past Surgical History Is Significant For: Past Surgical History:  Procedure Laterality Date  . CESAREAN SECTION  2008  . COLONOSCOPY WITH PROPOFOL N/A 02/08/2018   Procedure: COLONOSCOPY WITH PROPOFOL;  Surgeon: Lin Landsman, MD;  Location: [facility] ENDOSCOPY;  Service: Gastroenterology;  Laterality: N/A;  . CYSTECTOMY     cyst on lip  . ESOPHAGOGASTRODUODENOSCOPY (EGD) WITH PROPOFOL N/A 02/08/2018   Procedure: ESOPHAGOGASTRODUODENOSCOPY (EGD) WITH PROPOFOL;  Surgeon: Lin Landsman, MD;  Location: [place];   Service: Gastroenterology;  Laterality: N/A;  . GASTRIC BYPASS     8/18  . MASS EXCISION  06/23/2011   Procedure: EXCISION MASS;  Surgeon: Wynonia Sours, MD;  Location: [place];  Service: Orthopedics;  Laterality: Left;  left thumb and left index finger  . MULTIPLE TOOTH EXTRACTIONS  2/12  . ROTATOR CUFF REPAIR     right 09/2017 tear repair     Her Family History Is Significant For: Family History  Problem Relation Age of Onset  . Heart disease Father   . Glaucoma Father   . Thyroid disease Mother   . Hypertension Mother   . Alcohol abuse Mother   . Depression Mother   . Prostate cancer Maternal Grandfather   . Asthma Son   . Learning disabilities Son     Her Social History Is Significant For: Social History   Socioeconomic History  . Marital status: Married  Spouse name: Not on file  . Number of children: 1   . Years of education: BA  . Highest education level: Not on file  Occupational History  . Occupation: [employer]  . Financial resource strain: Not on file  . Food insecurity:    Worry: Not on file    Inability: Not on file  . Transportation needs:    Medical: Not on file    Non-medical: Not on file  Tobacco Use  . Smoking status: Former Smoker    Packs/day: 0.50    Types: Cigarettes    Last attempt to quit: 04/2015    Years since quitting: 2.9  . Smokeless tobacco: Never Used  Substance and Sexual Activity  . Alcohol use: Yes    Alcohol/week: 7.0 standard drinks    Types: 7 Standard drinks or equivalent per week    Comment: daily wine  . Drug use: Not Currently  . Sexual activity: Not on file  Lifestyle  . Physical activity:    Days per week: Not on file    Minutes per session: Not on file  . Stress: Not on file  Relationships  . Social connections:    Talks on phone: Not on file    Gets together: Not on file    Attends religious service: Not on file    Active member of club or organization: Not on file     Attends meetings of clubs or organizations: Not on file    Relationship status: Not on file  Other Topics Concern  . Not on file  Social History Narrative   Drinks about 1 cup of coffee a day, occasionally drinks a soda.    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 Cyclinex [Tetracycline Hcl] Shortness Of Breath  . Demeclocycline Shortness Of Breath  . Doxycycline Shortness Of Breath  . Minocycline Shortness Of Breath  . Tetracycline Shortness Of Breath  . Tetracyclines & Related Shortness Of Breath  :   Her Current Medications Are:  Outpatient Encounter Medications as of 02/24/2018  Medication Sig  . ALPRAZolam (XANAX) 0.5 MG tablet Take 0.5 mg by mouth 2 (two) times daily as needed.   Marland Kitchen amLODipine (NORVASC) 5 MG tablet Take 2.5 mg by mouth daily.   Marland Kitchen CALCIUM-VITAMIN D PO Take by mouth 2 (two) times daily.  Marland Kitchen latanoprost (XALATAN) 0.005 % ophthalmic solution INSTILL 1 DROP(S) IN EACH EYE ONCE DAILY AT BEDTIME  . montelukast (SINGULAIR) 10 MG tablet Take 1 tablet (10 mg total) by mouth at bedtime.  . Multiple Vitamin (MULTI-VITAMINS) TABS Take by mouth.  Marland Kitchen omeprazole (PRILOSEC) 40 MG capsule Take 1 capsule (40 mg total) by mouth 2 (two) times daily before a meal.  . SUMAtriptan (IMITREX) 50 MG tablet May repeat in 2 hours if headache persists or recurs. Max 4 pills in 24 hours  . traZODone (DESYREL) 100 MG tablet Take 100 mg by mouth at bedtime.   . varenicline (CHANTIX) 1 MG tablet Take 1 tablet (1 mg total) by mouth 2 (two) times daily.  . Varenicline Tartrate (CHANTIX PO) Chantix Starting Month Box 0.5 mg (11)-1 mg (42) tablets in dose pack  . Vilazodone HCl (VIIBRYD) 20 MG TABS Take 20 mg by mouth daily.  . [DISCONTINUED] LORazepam (ATIVAN) 1  MG tablet PLEASE SEE ATTACHED FOR DETAILED DIRECTIONS   Facility-Administered Encounter Medications as of  02/24/2018  Medication  . betamethasone acetate-betamethasone sodium phosphate (CELESTONE) injection 3 mg  :  Review of Systems:  Out of a complete 14 point review of systems, all are reviewed and negative with the exception of these symptoms as listed below: Review of Systems  Neurological:       Pt presents today to discuss her migraines. Pt gets headaches frequently but has migraines Thursday- Sunday. Pt wakes up with a headache and does endorse snoring. She said that she didn't complete the sleep study in 2016 because she doesn't think she has sleep apnea.  Epworth Sleepiness Scale 0= would never doze 1= slight chance of dozing 2= moderate chance of dozing 3= high chance of dozing  Sitting and reading: 1 Watching TV: 1 Sitting inactive in a public place (ex. Theater or meeting): 0 As a passenger in a car for an hour without a break: 2 Lying down to rest in the afternoon: 3 Sitting and talking to someone: 0 Sitting quietly after lunch (no alcohol): 1 In a car, while stopped in traffic: 0 Total: 8     Objective:  Neurological Exam  Physical Exam Physical Examination:   Vitals:   02/24/18 1004  BP: (!) 150/105  Pulse: 71    General Examination: The patient is a very pleasant 47 y.o. female in no acute distress. She appears well-developed and well-nourished and well groomed.   HEENT: Normocephalic, atraumatic, pupils are equal, round and reactive to light and accommodation. Funduscopic exam is normal with sharp disc margins noted. Extraocular tracking is good without limitation to gaze excursion or nystagmus noted. Normal smooth pursuit is noted. Hearing is grossly intact. Face is symmetric with normal facial animation and normal facial sensation. Speech is clear with no dysarthria noted. There is no hypophonia. There is no lip, neck/head, jaw or voice tremor. Neck is supple with full range of passive and active motion. There are no carotid bruits on auscultation.  Oropharynx exam reveals: mild mouth dryness, braces upper and lower, good dental hygiene and moderate airway crowding, due to narrow airway entry, tonsils in place and wider uvula. Mallampati is class II. Tongue protrudes centrally and palate elevates symmetrically. Tonsils are  1+, right more prominent. Neck size is 14 7/8 inches.    Chest: Clear to auscultation without wheezing, rhonchi or crackles noted.  Heart: S1+S2+0, regular and normal without murmurs, rubs or gallops noted.   Abdomen: Soft, non-tender and non-distended with normal bowel sounds appreciated on auscultation.  Extremities: There is no pitting edema in the distal lower extremities bilaterally..  Skin: Warm and dry without trophic changes noted. There are no varicose veins.  Musculoskeletal: exam reveals no obvious joint deformities, tenderness or joint swelling or erythema with the exception of mild right knee pain and mild swelling noted of her right knee. She has slight decrease of range of motion of her right knee joint.   Neurologically:  Mental status: The patient is awake, alert and oriented in all 4 spheres. Her immediate and remote memory, attention, language skills and fund of knowledge are appropriate. There is no evidence of aphasia, agnosia, apraxia or anomia. Speech is clear with normal prosody and enunciation. Thought process is linear. Mood is normal and affect is normal.  Cranial nerves II - XII are as described above under HEENT exam. In addition: shoulder shrug is normal with equal shoulder height noted. Motor exam: Normal bulk, strength and  tone is noted. There is no tremor. Romberg is negative. Reflexes are 1+ throughout. Fine motor skills and coordination: intact grossly.   Cerebellar testing: No dysmetria or intention tremor. There is no truncal or gait ataxia.  Sensory exam: intact to light touch in the upper and lower extremities.  Gait, station and balance: She stands easily. No veering to one  side is noted. No leaning to one side is noted. Posture is age-appropriate and stance is narrow based. Gait shows normal stride length and normal pace. No problems turning are noted. Tandem walk is unremarkable.   Assessment and Plan:   In summary, Adrienne Savage is a very pleasant 47 year old female with an underlying medical history of allergies, asthma, anxiety, depression, eczema, reflux disease, hypertension, prior smoking, obesity with status post gastric bypass surgery, who presents for evaluation of her recurrent headaches. She appears to have several contributors to headache, she may have migraines in addition to headaches triggered by medication, sleep deprivation, stress etc. She is not optimally hydrated at times. We talked about potential headache triggers. She has had some success for her migrainous headaches with as needed sumatriptan, she can continue with this. Neurological exam is thankfully nonfocal. She does have a crowded appearing airway. She would benefit from sleep study testing to rule out obstructive sleep apnea. OSA may contribute to morning headaches and recurrent headaches area and I explained this to her. She would be willing to proceed with a sleep study and consider CPAP therapy if needed. I also ordered a brain MRI with and without contrast. We will call her with her test results to keep her posted and I will see her back routinely after testing is completed. She is encouraged to keep a headache diary.  Thank you very much for allowing me to participate in the care of this nice patient. If I can be of any further assistance to you please do not hesitate to call me at [phone].  Sincerely,   Star Age, MD, PhD

## 2018-02-24 NOTE — Patient Instructions (Signed)
We will do a brain scan, called MRI and call you with the test results. We will have to schedule you for this on a separate date. This test requires authorization from your insurance, and we will take care of the insurance process.  We will do a sleep study to rule out sleep apnea.   Chantix can cause headaches, keep that in mind.   Please keep a headache log/diary.   Keep yourself well hydrated.

## 2018-03-01 ENCOUNTER — Telehealth: Payer: Self-pay | Admitting: Neurology

## 2018-03-01 NOTE — Telephone Encounter (Signed)
[employer] order sent to GI. They will obtain the auth and will reach out to the pt to schedule.  °

## 2018-03-03 ENCOUNTER — Ambulatory Visit (INDEPENDENT_AMBULATORY_CARE_PROVIDER_SITE_OTHER): Payer: Managed Care, Other (non HMO) | Admitting: Gastroenterology

## 2018-03-03 ENCOUNTER — Encounter: Payer: Self-pay | Admitting: Gastroenterology

## 2018-03-03 VITALS — BP 134/89 | HR 70 | Resp 17 | Ht 64.0 in | Wt 197.2 lb

## 2018-03-03 DIAGNOSIS — K287 Chronic gastrojejunal ulcer without hemorrhage or perforation: Secondary | ICD-10-CM

## 2018-03-03 NOTE — Progress Notes (Signed)
Open  Cephas Darby, MD [address]  [place]  [place], [place] [address]  Main: [phone]  Fax: [phone]    Gastroenterology Consultation  Referring Provider:     McLean-Scocuzza, [name] * Primary Care Physician:  McLean-Scocuzza, [name], MD Primary Gastroenterologist:  Dr. Cephas Darby Reason for Consultation:   chronic epigastric pain, anastomotic ulcer        HPI:   Adrienne Savage is a 47 y.o. female referred by Dr. Terese Door, [name], MD  for consultation & management of colon cancer screening and chronic epigastric pain.  Patient had Roux-en-Y gastric bypass in 2018 at [facility].  She lost about 64 pounds since then.  She reports that, since surgery she has been experiencing chronic epigastric pain.  Underwent 2 upper endoscopies by her bariatric surgeon.  She was told that she had anastomotic ulcer.  She has been taking Protonix 40 mg daily in addition to Pepcid and sucralfate.  She went to ER in 10/2017 secondary to left upper quadrant discomfort, underwent CT which revealed possible gastric thickening and soft tissue edema.  She has been treated as possible gastritis by her bariatric surgeon.  And scheduled for upper endoscopy next week.  Patient denies any other upper GI symptoms.  She recently quit smoking and on Chantix.  She is also due for colonoscopy for colon cancer screening.  She denies any lower GI symptoms.  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  I started her on omeprazole 40 mg twice daily.  Patient reports moderate improvement in her symptoms.  She continues to stay away from tobacco.  NSAIDs: None  Antiplts/Anticoagulants/Anti thrombotics: None  GI Procedures:  Upper endoscopy 02/08/2018 - Roux-en-Y gastrojejunostomy with gastrojejunal anastomosis characterized by ulceration. - Normal gastric body. - Normal gastroesophageal junction and esophagus. - No specimens  collected.  Colonoscopy 02/08/2018 - The entire examined colon is normal. - The distal rectum and anal verge are normal on retroflexion view. - No specimens collected.  Upper endoscopy in 06/2017, report not available  Upper endoscopy at [facility] on 07/18/2016, report not available Pathology A. 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 No Helicobacter pylori organisms are seen on routine H&E stain. No intestinal metaplasia, dysplasia, or carcinoma is seen.  Past Medical History:  Diagnosis Date  . Allergy   . Anxiety   . Depression   . Eczema   . Fatigue   . Frequent headaches   . Gastric ulcer   . GERD (gastroesophageal reflux disease)   . Goiter   . History of chicken pox   . HPV in female   . Hypertension   . Insomnia   . Leg pain   . Snoring   . UTI (urinary tract infection)     Past Surgical History:  Procedure Laterality Date  . CESAREAN SECTION  2008  . COLONOSCOPY WITH PROPOFOL N/A 02/08/2018   Procedure: COLONOSCOPY WITH PROPOFOL;  Surgeon: Lin Landsman, MD;  Location: [facility] ENDOSCOPY;  Service: Gastroenterology;  Laterality: N/A;  . CYSTECTOMY     cyst on lip  . ESOPHAGOGASTRODUODENOSCOPY (EGD) WITH PROPOFOL N/A 02/08/2018   Procedure: ESOPHAGOGASTRODUODENOSCOPY (EGD) WITH PROPOFOL;  Surgeon: Lin Landsman, MD;  Location: [place];  Service: Gastroenterology;  Laterality: N/A;  . GASTRIC BYPASS     8/18  . MASS EXCISION  06/23/2011   Procedure: EXCISION MASS;  Surgeon: Dillard Essex  Fredna Dow, MD;  Location: [place];  Service: Orthopedics;  Laterality: Left;  left thumb and left index finger  . MULTIPLE TOOTH EXTRACTIONS  2/12  . ROTATOR CUFF REPAIR     right 09/2017 tear repair     Current Outpatient Medications:  .  ALPRAZolam (XANAX) 0.5 MG tablet, Take 0.5 mg by mouth 2 (two) times daily as needed. , Disp: , Rfl:  .  amLODipine  (NORVASC) 5 MG tablet, Take 2.5 mg by mouth daily. , Disp: , Rfl:  .  CALCIUM-VITAMIN D PO, Take by mouth 2 (two) times daily., Disp: , Rfl:  .  latanoprost (XALATAN) 0.005 % ophthalmic solution, INSTILL 1 DROP(S) IN EACH EYE ONCE DAILY AT BEDTIME, Disp: , Rfl: 6 .  montelukast (SINGULAIR) 10 MG tablet, Take 1 tablet (10 mg total) by mouth at bedtime., Disp: 90 tablet, Rfl: 3 .  Multiple Vitamin (MULTI-VITAMINS) TABS, Take by mouth., Disp: , Rfl:  .  omeprazole (PRILOSEC) 40 MG capsule, Take 1 capsule (40 mg total) by mouth 2 (two) times daily before a meal., Disp: 180 capsule, Rfl: 0 .  SUMAtriptan (IMITREX) 50 MG tablet, May repeat in 2 hours if headache persists or recurs. Max 4 pills in 24 hours, Disp: 10 tablet, Rfl: 2 .  varenicline (CHANTIX) 1 MG tablet, Take 1 tablet (1 mg total) by mouth 2 (two) times daily., Disp: 180 tablet, Rfl: 0 .  Vilazodone HCl (VIIBRYD) 20 MG TABS, Take 20 mg by mouth daily., Disp: , Rfl:  .  traZODone (DESYREL) 100 MG tablet, Take 100 mg by mouth at bedtime. , Disp: , Rfl: 0 .  Varenicline Tartrate (CHANTIX PO), Chantix Starting Month Box 0.5 mg (11)-1 mg (42) tablets in dose pack, Disp: , Rfl:   Current Facility-Administered Medications:  .  betamethasone acetate-betamethasone sodium phosphate (CELESTONE) injection 3 mg, 3 mg, Intramuscular, Once, Edrick Kins, DPM   Family History  Problem Relation Age of Onset  . Heart disease Father   . Glaucoma Father   . Thyroid disease Mother   . Hypertension Mother   . Alcohol abuse Mother   . Depression Mother   . Prostate cancer Maternal Grandfather   . Asthma Son   . Learning disabilities Son      Social History   Tobacco Use  . Smoking status: Former Smoker    Packs/day: 0.50    Types: Cigarettes    Last attempt to quit: 04/2015    Years since quitting: 2.9  . Smokeless tobacco: Never Used  Substance Use Topics  . Alcohol use: Yes    Alcohol/week: 7.0 standard drinks    Types: 7 Standard drinks  or equivalent per week    Comment: daily wine  . Drug use: Not Currently    Allergies as of 03/03/2018 - Review Complete 03/03/2018  Allergen Reaction Noted  . Cyclinex [tetracycline hcl] Shortness Of Breath 06/16/2011  . Demeclocycline Shortness Of Breath 06/16/2011  . Doxycycline Shortness Of Breath 06/16/2011  . Minocycline Shortness Of Breath 06/16/2011  . Tetracycline Shortness Of Breath 09/04/2015  . Tetracyclines & related Shortness Of Breath 06/16/2011    Review of Systems:    All systems reviewed and negative except where noted in HPI.   Physical Exam:  BP 134/89 (BP Location: Left Arm, Patient Position: Sitting, Cuff Size: Large)   Pulse 70   Resp 17   Ht 5\' 4"  (1.626 m)   Wt 197 lb 3.2 oz (89.4 kg)   BMI 33.85 kg/m  No LMP recorded. (Menstrual status: Irregular Periods).  General:   Alert,  Well-developed, well-nourished, pleasant and cooperative in NAD Head:  Normocephalic and atraumatic. Eyes:  Sclera clear, no icterus.   Conjunctiva pink. Ears:  Normal auditory acuity. Nose:  No deformity, discharge, or lesions. Mouth:  No deformity or lesions,oropharynx pink & moist. Neck:  Supple; no masses or thyromegaly. Lungs:  Respirations even and unlabored.  Clear throughout to auscultation.   No wheezes, crackles, or rhonchi. No acute distress. Heart:  Regular rate and rhythm; no murmurs, clicks, rubs, or gallops. Abdomen:  Normal bowel sounds. Soft, mild epigastric tenderness and non-distended without masses, hepatosplenomegaly or hernias noted.  No guarding or rebound tenderness.   Rectal: Not performed Msk:  Symmetrical without gross deformities. Good, equal movement & strength bilaterally. Pulses:  Normal pulses noted. Extremities:  No clubbing or edema.  No cyanosis. Neurologic:  Alert and oriented x3;  grossly normal neurologically. Skin:  Intact without significant lesions or rashes. No jaundice. Psych:  Alert and cooperative. Normal mood and  affect.  Imaging Studies: Reviewed  Assessment and Plan:   Adrienne Savage is a 47 y.o. African-American female with history of Roux-en-Y gastric bypass in 10/2016 at [facility], chronic epigastric pain and due for colon cancer screening  Chronic epigastric pain: Secondary to anastomotic ulcer Continue omeprazole 40 mg twice daily for 3 to 6 months Abstinence from smoking Currently on Chantix  Colon cancer screening: Normal colonoscopy, recommend surveillance in 2029   Follow up in 3-6 months   Cephas Darby, MD

## 2018-03-09 ENCOUNTER — Telehealth: Payer: Self-pay

## 2018-03-09 DIAGNOSIS — R519 Headache, unspecified: Secondary | ICD-10-CM

## 2018-03-09 DIAGNOSIS — R51 Headache: Principal | ICD-10-CM

## 2018-03-09 NOTE — Telephone Encounter (Signed)
VO for HST from Dr. Athar received. HST order placed.  

## 2018-03-09 NOTE — Telephone Encounter (Signed)
Insurance has denied in lab sleep study request but did approve for a HST. Do you want to order HST?

## 2018-03-09 NOTE — Addendum Note (Signed)
Addended by: Lester Pasco A on: 03/09/2018 01:02 PM   Modules accepted: Orders

## 2018-03-12 ENCOUNTER — Telehealth: Payer: Self-pay

## 2018-03-12 NOTE — Telephone Encounter (Signed)
Please adv pt that her insurance denied her MRI brain. Since she had a non-focal neurological exam, we will follow her clinically.  Proceed with sleep study or HST as planned.

## 2018-03-12 NOTE — Telephone Encounter (Signed)
Received this notification: "[employer] has denied their MRI's. Dr Rexene Alberts can do a P2P if she chooses to do so"  Case #: [id number]

## 2018-03-12 NOTE — Telephone Encounter (Signed)
I called pt and discussed Dr. Guadelupe Sabin recommendations regarding the MRI . Pt is agreeable to continuing with the sleep study and I will call her with those results. Pt verbalized understanding.

## 2018-03-13 ENCOUNTER — Other Ambulatory Visit: Payer: Managed Care, Other (non HMO)

## 2018-03-22 ENCOUNTER — Other Ambulatory Visit: Payer: Self-pay | Admitting: Gastroenterology

## 2018-03-22 DIAGNOSIS — R1013 Epigastric pain: Principal | ICD-10-CM

## 2018-03-22 DIAGNOSIS — G8929 Other chronic pain: Secondary | ICD-10-CM

## 2018-03-23 ENCOUNTER — Other Ambulatory Visit: Payer: Self-pay | Admitting: Internal Medicine

## 2018-03-23 DIAGNOSIS — G4700 Insomnia, unspecified: Secondary | ICD-10-CM

## 2018-03-23 DIAGNOSIS — Z720 Tobacco use: Secondary | ICD-10-CM

## 2018-03-23 MED ORDER — VARENICLINE TARTRATE 1 MG PO TABS: 1.0000 mg | ORAL_TABLET | Freq: Two times a day (BID) | ORAL | 0 refills | Status: DC

## 2018-03-25 ENCOUNTER — Ambulatory Visit (INDEPENDENT_AMBULATORY_CARE_PROVIDER_SITE_OTHER): Payer: Managed Care, Other (non HMO) | Admitting: Neurology

## 2018-03-25 DIAGNOSIS — R519 Headache, unspecified: Secondary | ICD-10-CM

## 2018-03-25 DIAGNOSIS — G4733 Obstructive sleep apnea (adult) (pediatric): Secondary | ICD-10-CM

## 2018-03-25 DIAGNOSIS — R51 Headache: Secondary | ICD-10-CM

## 2018-04-08 ENCOUNTER — Telehealth: Payer: Self-pay | Admitting: Neurology

## 2018-04-08 NOTE — Progress Notes (Signed)
Patient referred by Dr. Terese Door, seen by me on 02/24/18, HST on 03/25/18.    Please call and notify the patient that the recent home sleep test showed obstructive sleep apnea. OSA is overall mild, but worth treating to see if she feels better after treatment, esp with respect to her recurrent HAs. To that end I recommend treatment for this in the form of autoPAP, which means, that we don't have to bring her in for a sleep study with CPAP, but will let her try an autoPAP machine at home, through a DME company (of her choice, or as per insurance requirement). The DME representative will educate her on how to use the machine, how to put the mask on, etc. I have placed an order in the chart. Please send referral, talk to patient, send report to referring MD. We will need a FU in sleep clinic for 10 weeks post-PAP set up, please arrange that with me or one of our NPs. Thanks,   Star Age, MD, PhD [facility])

## 2018-04-08 NOTE — Addendum Note (Signed)
Addended by: Star Age on: 04/08/2018 08:08 AM   Modules accepted: Orders

## 2018-04-08 NOTE — Procedures (Signed)
[facility] [place], [place] [address] NAME:   Adrienne Savage                                                   DOB: Aug 15, 1970 MEDICAL RECORD no:   [id number]                                    DOS: 03/25/2018  REFERRING PHYSICIAN: [name] Mclean-Scocuzza, MD STUDY PERFORMED: Home Sleep Test on [facility] HISTORY: 48 year old woman with a history of allergies, asthma, anxiety, depression, eczema, reflux disease, hypertension, prior smoking, obesity with status post gastric bypass surgery, who reports recurrent headaches for the past several years. Her Epworth sleepiness score is 8 out of 24, BMI of 33.1. STUDY RESULTS:   Total Recording Time: 9 hrs, 59 mins; Total Sleep Time: 7 hrs, 34 mins Total Apnea/Hypopnea Index (AHI): 12.0/h; RDI: 12.8/h; REM AHI: 10.5/h Average Oxygen Saturation: 96%; Lowest Oxygen Desaturation: 91%  Total Time Oxygen Saturation Below or at 88%: 0 minutes  Average Heart Rate:  69 bpm  IMPRESSION: OSA RECOMMENDATION: This home sleep test demonstrates overall mild obstructive sleep apnea with a total AHI of 12/hour and O2 nadir of 91%. Given the patient's medical history and sleep related complaints, treatment with positive airway pressure is a reasonable choice for treatment. This can be achieved in the form of autoPAP trial/titration at home; autoPAP will be offered to the patient. A full night CPAP titration study will help with proper treatment settings and mask fitting, if needed. Alternative treatments include ongoing weight loss along with avoidance of the supine sleep position, or an oral appliance in appropriate candidates. Please note that untreated obstructive sleep apnea may carry additional perioperative morbidity. Patients with significant obstructive sleep apnea should receive perioperative PAP therapy and the surgeons and particularly the anesthesiologist should be informed of the diagnosis and the severity of  the sleep disordered breathing. The patient should be cautioned not to drive, work at heights, or operate dangerous or heavy equipment when tired or sleepy. Review and reiteration of good sleep hygiene measures should be pursued with any patient. Other causes of the patient's symptoms, including circadian rhythm disturbances, an underlying mood disorder, medication effect and/or an underlying medical problem cannot be ruled out based on this test. Clinical correlation is recommended. The patient and her referring provider will be notified of the test results. The patient will be seen in follow up in sleep clinic at [facility]. I certify that I have reviewed the raw data recording prior to the issuance of this report in accordance with the standards of the American Academy of Sleep Medicine (AASM).  Star Age, MD, PhD [facility]) Diplomat, ABPN (Neurology and Sleep)

## 2018-04-08 NOTE — Telephone Encounter (Signed)
I called pt. I advised pt that Dr. Rexene Alberts reviewed their sleep study results and found that pt has mild sleep apnea. Dr. Rexene Alberts recommends that pt start auto CPAP. I reviewed PAP compliance expectations with the pt. Pt is agreeable to starting a CPAP. I advised pt that an order will be sent to a DME, [facility], and [facility] will call the pt within about one week after they file with the pt's insurance. [facility] will show the pt how to use the machine, fit for masks, and troubleshoot the CPAP if needed. A follow up appt was made for insurance purposes with Dr. Rexene Alberts on March 12,2020 at 8:30am. Pt verbalized understanding to arrive 15 minutes early and bring their CPAP. A letter with all of this information in it will be mailed to the pt as a reminder. I verified with the pt that the address we have on file is correct. Pt verbalized understanding of results. Pt had no questions at this time but was encouraged to call back if questions arise. I have sent the order to [employer] and have received confirmation that they have received the order.

## 2018-04-08 NOTE — Telephone Encounter (Signed)
-----   Message from Star Age, MD sent at 04/08/2018  8:08 AM EST ----- Patient referred by Dr. Terese Door, seen by me on 02/24/18, HST on 03/25/18.    Please call and notify the patient that the recent home sleep test showed obstructive sleep apnea. OSA is overall mild, but worth treating to see if she feels better after treatment, esp with respect to her recurrent HAs. To that end I recommend treatment for this in the form of autoPAP, which means, that we don't have to bring her in for a sleep study with CPAP, but will let her try an autoPAP machine at home, through a DME company (of her choice, or as per insurance requirement). The DME representative will educate her on how to use the machine, how to put the mask on, etc. I have placed an order in the chart. Please send referral, talk to patient, send report to referring MD. We will need a FU in sleep clinic for 10 weeks post-PAP set up, please arrange that with me or one of our NPs. Thanks,   Star Age, MD, PhD [facility])

## 2018-04-15 DIAGNOSIS — G4733 Obstructive sleep apnea (adult) (pediatric): Secondary | ICD-10-CM | POA: Diagnosis not present

## 2018-04-19 DIAGNOSIS — F329 Major depressive disorder, single episode, unspecified: Secondary | ICD-10-CM | POA: Diagnosis not present

## 2018-04-19 DIAGNOSIS — Z79899 Other long term (current) drug therapy: Secondary | ICD-10-CM | POA: Diagnosis not present

## 2018-04-19 DIAGNOSIS — F419 Anxiety disorder, unspecified: Secondary | ICD-10-CM | POA: Diagnosis not present

## 2018-05-12 ENCOUNTER — Ambulatory Visit (INDEPENDENT_AMBULATORY_CARE_PROVIDER_SITE_OTHER): Payer: BLUE CROSS/BLUE SHIELD | Admitting: Internal Medicine

## 2018-05-12 ENCOUNTER — Encounter: Payer: Self-pay | Admitting: Internal Medicine

## 2018-05-12 VITALS — BP 138/88 | HR 73 | Temp 98.4°F | Ht 64.0 in | Wt 199.2 lb

## 2018-05-12 DIAGNOSIS — E559 Vitamin D deficiency, unspecified: Secondary | ICD-10-CM

## 2018-05-12 DIAGNOSIS — G43909 Migraine, unspecified, not intractable, without status migrainosus: Secondary | ICD-10-CM

## 2018-05-12 DIAGNOSIS — Z23 Encounter for immunization: Secondary | ICD-10-CM | POA: Diagnosis not present

## 2018-05-12 DIAGNOSIS — Z1329 Encounter for screening for other suspected endocrine disorder: Secondary | ICD-10-CM

## 2018-05-12 DIAGNOSIS — Z9884 Bariatric surgery status: Secondary | ICD-10-CM | POA: Diagnosis not present

## 2018-05-12 DIAGNOSIS — I10 Essential (primary) hypertension: Secondary | ICD-10-CM | POA: Diagnosis not present

## 2018-05-12 DIAGNOSIS — E611 Iron deficiency: Secondary | ICD-10-CM

## 2018-05-12 DIAGNOSIS — E538 Deficiency of other specified B group vitamins: Secondary | ICD-10-CM

## 2018-05-12 DIAGNOSIS — R739 Hyperglycemia, unspecified: Secondary | ICD-10-CM | POA: Diagnosis not present

## 2018-05-12 MED ORDER — AMLODIPINE BESYLATE 5 MG PO TABS: 5.0000 mg | ORAL_TABLET | Freq: Every day | ORAL | 3 refills | Status: DC

## 2018-05-12 MED ORDER — HYDROCHLOROTHIAZIDE 12.5 MG PO TABS: 12.5000 mg | ORAL_TABLET | Freq: Every day | ORAL | 3 refills | Status: DC

## 2018-05-12 NOTE — Progress Notes (Signed)
Chief Complaint  Patient presents with  . Medication Refill   F/u  1. HTN uncontrolled on norvasc 2.5 mg qd  2. Migraines helped with imitrex 3. S/p gastric bypass with h/o vitamin D, iron def h/o binge eating recently  4. Wants Tdap today    Review of Systems  Constitutional: Negative for weight loss.  HENT: Negative for hearing loss.   Eyes: Negative for blurred vision.  Respiratory: Negative for shortness of breath.   Cardiovascular: Negative for chest pain.  Musculoskeletal: Negative for falls.  Skin: Negative for rash.  Neurological: Negative for headaches.  Psychiatric/Behavioral: Negative for depression.   Past Medical History:  Diagnosis Date  . Allergy   . Anxiety   . Colon cancer screening   . Depression   . Eczema   . Fatigue   . Frequent headaches   . Gastric ulcer   . GERD (gastroesophageal reflux disease)   . Goiter   . History of chicken pox   . HPV in female   . Hypertension   . Insomnia   . Leg pain   . Snoring   . UTI (urinary tract infection)    Past Surgical History:  Procedure Laterality Date  . CESAREAN SECTION  2008  . COLONOSCOPY WITH PROPOFOL N/A 02/08/2018   Procedure: COLONOSCOPY WITH PROPOFOL;  Surgeon: Lin Landsman, MD;  Location: [facility] ENDOSCOPY;  Service: Gastroenterology;  Laterality: N/A;  . CYSTECTOMY     cyst on lip  . ESOPHAGOGASTRODUODENOSCOPY (EGD) WITH PROPOFOL N/A 02/08/2018   Procedure: ESOPHAGOGASTRODUODENOSCOPY (EGD) WITH PROPOFOL;  Surgeon: Lin Landsman, MD;  Location: [place];  Service: Gastroenterology;  Laterality: N/A;  . GASTRIC BYPASS     8/18  . MASS EXCISION  06/23/2011   Procedure: EXCISION MASS;  Surgeon: Wynonia Sours, MD;  Location: [place];  Service: Orthopedics;  Laterality: Left;  left thumb and left index finger  . MULTIPLE TOOTH EXTRACTIONS  2/12  . ROTATOR CUFF REPAIR     right 09/2017 tear repair    Family History  Problem Relation Age of Onset  . Heart  disease Father   . Glaucoma Father   . Thyroid disease Mother   . Hypertension Mother   . Alcohol abuse Mother   . Depression Mother   . Prostate cancer Maternal Grandfather   . Asthma Son   . Learning disabilities Son    Social History   Socioeconomic History  . Marital status: Married    Spouse name: Not on file  . Number of children: 1   . Years of education: BA  . Highest education level: Not on file  Occupational History  . Occupation: [employer]  . Financial resource strain: Not on file  . Food insecurity:    Worry: Not on file    Inability: Not on file  . Transportation needs:    Medical: Not on file    Non-medical: Not on file  Tobacco Use  . Smoking status: Former Smoker    Packs/day: 0.50    Types: Cigarettes    Last attempt to quit: 04/2015    Years since quitting: 3.1  . Smokeless tobacco: Never Used  Substance and Sexual Activity  . Alcohol use: Yes    Alcohol/week: 7.0 standard drinks    Types: 7 Standard drinks or equivalent per week    Comment: daily wine  . Drug use: Not Currently  . Sexual activity: Not on file  Lifestyle  .  Physical activity:    Days per week: Not on file    Minutes per session: Not on file  . Stress: Not on file  Relationships  . Social connections:    Talks on phone: Not on file    Gets together: Not on file    Attends religious service: Not on file    Active member of club or organization: Not on file    Attends meetings of clubs or organizations: Not on file    Relationship status: Not on file  . Intimate partner violence:    Fear of current or ex partner: Not on file    Emotionally abused: Not on file    Physically abused: Not on file    Forced sexual activity: Not on file  Other Topics Concern  . Not on file  Social History Narrative   Drinks about 1 cup of coffee a day, occasionally drinks a soda.    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 ALPRAZolam (XANAX) 0.5 MG tablet Take 0.5 mg by mouth 2 (two) times daily as needed.   Marland Kitchen amLODipine (NORVASC) 5 MG tablet Take 2.5 mg by mouth daily.   Marland Kitchen CALCIUM-VITAMIN D PO Take by mouth 2 (two) times daily.  Marland Kitchen latanoprost (XALATAN) 0.005 % ophthalmic solution INSTILL 1 DROP(S) IN EACH EYE ONCE DAILY AT BEDTIME  . montelukast (SINGULAIR) 10 MG tablet Take 1 tablet (10 mg total) by mouth at bedtime.  . Multiple Vitamin (MULTI-VITAMINS) TABS Take by mouth.  . SUMAtriptan (IMITREX) 50 MG tablet May repeat in 2 hours if headache persists or recurs. Max 4 pills in 24 hours  . traZODone (DESYREL) 100 MG tablet Take 100 mg by mouth at bedtime.   . varenicline (CHANTIX) 1 MG tablet Take 1 tablet (1 mg total) by mouth 2 (two) times daily.  . Varenicline Tartrate (CHANTIX PO) Chantix Starting Month Box 0.5 mg (11)-1 mg (42) tablets in dose pack  . Vilazodone HCl (VIIBRYD) 20 MG TABS Take 20 mg by mouth daily.   Current Facility-Administered Medications for the 05/12/18 encounter (Office Visit) with McLean-Scocuzza, [name], MD  Medication  . betamethasone acetate-betamethasone sodium phosphate (CELESTONE) injection 3 mg   Allergies  Allergen Reactions  . Cyclinex [Tetracycline Hcl] Shortness Of Breath  . Demeclocycline Shortness Of Breath  . Doxycycline Shortness Of Breath  . Minocycline Shortness Of Breath  . Tetracycline Shortness Of Breath  . Tetracyclines & Related Shortness Of Breath   No results found for this or any previous visit (from the past 2160 hour(s)). Objective  Body mass index is 34.19 kg/m. Wt Readings from Last 3 Encounters:  05/12/18 199 lb 3.2 oz (90.4 kg)  03/03/18 197 lb 3.2 oz (89.4 kg)  02/24/18 193 lb (87.5 kg)   Temp Readings from Last 3 Encounters:  05/12/18 98.4 F (36.9 C) (Oral)  02/08/18 (!) 97 F (36.1 C) (Tympanic)  12/17/17 98.6 F (37 C) (Oral)   BP Readings from Last 3 Encounters:   05/12/18 138/88  03/03/18 134/89  02/24/18 (!) 150/105   Pulse Readings from Last 3 Encounters:  05/12/18 73  03/03/18 70  02/24/18 71    Physical Exam Vitals signs and nursing note reviewed.  Constitutional:      Appearance: Normal appearance. She is well-developed and well-groomed. She is obese.  HENT:     Head: Normocephalic and atraumatic.     Nose: Nose normal.     Mouth/Throat:     Mouth: Mucous membranes are moist.     Pharynx: Oropharynx is clear.  Eyes:     Conjunctiva/sclera: Conjunctivae normal.     Pupils: Pupils are equal, round, and reactive to light.  Cardiovascular:     Rate and Rhythm: Normal rate and regular rhythm.     Heart sounds: No murmur.  Pulmonary:     Effort: Pulmonary effort is normal.     Breath sounds: Normal breath sounds.  Skin:    General: Skin is warm and dry.  Neurological:     General: No focal deficit present.     Mental Status: She is alert and oriented to person, place, and time. Mental status is at baseline.     Gait: Gait normal.  Psychiatric:        Attention and Perception: Attention and perception normal.        Mood and Affect: Mood and affect normal.        Speech: Speech normal.        Behavior: Behavior normal. Behavior is cooperative.        Thought Content: Thought content normal.        Cognition and Memory: Cognition and memory normal.        Judgment: Judgment normal.     Assessment   1. htn  2. Migraines controlled  3. S/p gastric bypass  4. HM Plan   1. norvasc 5 inc., add hctz 12.5 mg qd  2. imitrex prn helping  3. Check fasting labs upcoming  4.  Flu shot utd Tdap given today Declines HIV check  Declines MMR   Reviewed labs 11/16/17   Pap records Dr. Raphael Gibney [facility] [place] h/o HPV/abnormal pap  -09/11/17 neg pap no HPV  Mammogram OB/GYN had 09/11/17 [facility] negative  Colonoscopy due age 55 y.o per pt pending with GI   Ortho Dr. Malena Catholic ortho  Dr. Emelda Brothers Bariatric  surgery   [facility] wears glasses FH glaucoma being monitored for elevated pressure in her eye not yet dx'ed glaucoma   Obtained records ob/GYN above Cryo cervicx 04/1999  Colp 03/06/98-09/20/97  H/o HSV Provider: Dr. [name] McLean-Scocuzza-Internal Medicine

## 2018-05-12 NOTE — Patient Instructions (Signed)
DASH Eating Plan  DASH stands for "Dietary Approaches to Stop Hypertension." The DASH eating plan is a healthy eating plan that has been shown to reduce high blood pressure (hypertension). It may also reduce your risk for type 2 diabetes, heart disease, and stroke. The DASH eating plan may also help with weight loss.  What are tips for following this plan?    General guidelines   Avoid eating more than 2,300 mg (milligrams) of salt (sodium) a day. If you have hypertension, you may need to reduce your sodium intake to 1,500 mg a day.   Limit alcohol intake to no more than 1 drink a day for nonpregnant women and 2 drinks a day for men. One drink equals 12 oz of beer, 5 oz of wine, or 1 oz of hard liquor.   Work with your health care provider to maintain a healthy body weight or to lose weight. Ask what an ideal weight is for you.   Get at least 30 minutes of exercise that causes your heart to beat faster (aerobic exercise) most days of the week. Activities may include walking, swimming, or biking.   Work with your health care provider or diet and nutrition specialist (dietitian) to adjust your eating plan to your individual calorie needs.  Reading food labels     Check food labels for the amount of sodium per serving. Choose foods with less than 5 percent of the Daily Value of sodium. Generally, foods with less than 300 mg of sodium per serving fit into this eating plan.   To find whole grains, look for the word "whole" as the first word in the ingredient list.  Shopping   Buy products labeled as "low-sodium" or "no salt added."   Buy fresh foods. Avoid canned foods and premade or frozen meals.  Cooking   Avoid adding salt when cooking. Use salt-free seasonings or herbs instead of table salt or sea salt. Check with your health care provider or pharmacist before using salt substitutes.   Do not fry foods. Cook foods using healthy methods such as baking, boiling, grilling, and broiling instead.   Cook with  heart-healthy oils, such as olive, canola, soybean, or sunflower oil.  Meal planning   Eat a balanced diet that includes:  ? 5 or more servings of fruits and vegetables each day. At each meal, try to fill half of your plate with fruits and vegetables.  ? Up to 6-8 servings of whole grains each day.  ? Less than 6 oz of lean meat, poultry, or fish each day. A 3-oz serving of meat is about the same size as a deck of cards. One egg equals 1 oz.  ? 2 servings of low-fat dairy each day.  ? A serving of nuts, seeds, or beans 5 times each week.  ? Heart-healthy fats. Healthy fats called Omega-3 fatty acids are found in foods such as flaxseeds and coldwater fish, like sardines, salmon, and mackerel.   Limit how much you eat of the following:  ? Canned or prepackaged foods.  ? Food that is high in trans fat, such as fried foods.  ? Food that is high in saturated fat, such as fatty meat.  ? Sweets, desserts, sugary drinks, and other foods with added sugar.  ? Full-fat dairy products.   Do not salt foods before eating.   Try to eat at least 2 vegetarian meals each week.   Eat more home-cooked food and less restaurant, buffet, and fast food.     When eating at a restaurant, ask that your food be prepared with less salt or no salt, if possible.  What foods are recommended?  The items listed may not be a complete list. Talk with your dietitian about what dietary choices are best for you.  Grains  Whole-grain or whole-wheat bread. Whole-grain or whole-wheat pasta. Brown rice. Oatmeal. Quinoa. Bulgur. Whole-grain and low-sodium cereals. Pita bread. Low-fat, low-sodium crackers. Whole-wheat flour tortillas.  Vegetables  Fresh or frozen vegetables (raw, steamed, roasted, or grilled). Low-sodium or reduced-sodium tomato and vegetable juice. Low-sodium or reduced-sodium tomato sauce and tomato paste. Low-sodium or reduced-sodium canned vegetables.  Fruits  All fresh, dried, or frozen fruit. Canned fruit in natural juice (without  added sugar).  Meat and other protein foods  Skinless chicken or turkey. Ground chicken or turkey. Pork with fat trimmed off. Fish and seafood. Egg whites. Dried beans, peas, or lentils. Unsalted nuts, nut butters, and seeds. Unsalted canned beans. Lean cuts of beef with fat trimmed off. Low-sodium, lean deli meat.  Dairy  Low-fat (1%) or fat-free (skim) milk. Fat-free, low-fat, or reduced-fat cheeses. Nonfat, low-sodium ricotta or cottage cheese. Low-fat or nonfat yogurt. Low-fat, low-sodium cheese.  Fats and oils  Soft margarine without trans fats. Vegetable oil. Low-fat, reduced-fat, or light mayonnaise and salad dressings (reduced-sodium). Canola, safflower, olive, soybean, and sunflower oils. Avocado.  Seasoning and other foods  Herbs. Spices. Seasoning mixes without salt. Unsalted popcorn and pretzels. Fat-free sweets.  What foods are not recommended?  The items listed may not be a complete list. Talk with your dietitian about what dietary choices are best for you.  Grains  Baked goods made with fat, such as croissants, muffins, or some breads. Dry pasta or rice meal packs.  Vegetables  Creamed or fried vegetables. Vegetables in a cheese sauce. Regular canned vegetables (not low-sodium or reduced-sodium). Regular canned tomato sauce and paste (not low-sodium or reduced-sodium). Regular tomato and vegetable juice (not low-sodium or reduced-sodium). Pickles. Olives.  Fruits  Canned fruit in a light or heavy syrup. Fried fruit. Fruit in cream or butter sauce.  Meat and other protein foods  Fatty cuts of meat. Ribs. Fried meat. Bacon. Sausage. Bologna and other processed lunch meats. Salami. Fatback. Hotdogs. Bratwurst. Salted nuts and seeds. Canned beans with added salt. Canned or smoked fish. Whole eggs or egg yolks. Chicken or turkey with skin.  Dairy  Whole or 2% milk, cream, and half-and-half. Whole or full-fat cream cheese. Whole-fat or sweetened yogurt. Full-fat cheese. Nondairy creamers. Whipped toppings.  Processed cheese and cheese spreads.  Fats and oils  Butter. Stick margarine. Lard. Shortening. Ghee. Bacon fat. Tropical oils, such as coconut, palm kernel, or palm oil.  Seasoning and other foods  Salted popcorn and pretzels. Onion salt, garlic salt, seasoned salt, table salt, and sea salt. Worcestershire sauce. Tartar sauce. Barbecue sauce. Teriyaki sauce. Soy sauce, including reduced-sodium. Steak sauce. Canned and packaged gravies. Fish sauce. Oyster sauce. Cocktail sauce. Horseradish that you find on the shelf. Ketchup. Mustard. Meat flavorings and tenderizers. Bouillon cubes. Hot sauce and Tabasco sauce. Premade or packaged marinades. Premade or packaged taco seasonings. Relishes. Regular salad dressings.  Where to find more information:   National Heart, Lung, and Blood Institute: www.nhlbi.nih.gov   American Heart Association: www.heart.org  Summary   The DASH eating plan is a healthy eating plan that has been shown to reduce high blood pressure (hypertension). It may also reduce your risk for type 2 diabetes, heart disease, and stroke.   With the   DASH eating plan, you should limit salt (sodium) intake to 2,300 mg a day. If you have hypertension, you may need to reduce your sodium intake to 1,500 mg a day.   When on the DASH eating plan, aim to eat more fresh fruits and vegetables, whole grains, lean proteins, low-fat dairy, and heart-healthy fats.   Work with your health care provider or diet and nutrition specialist (dietitian) to adjust your eating plan to your individual calorie needs.  This information is not intended to replace advice given to you by your health care provider. Make sure you discuss any questions you have with your health care provider.  Document Released: 03/06/2011 Document Revised: 03/10/2016 Document Reviewed: 03/10/2016  Elsevier Interactive Patient Education  2019 [employer]vier Inc[employer]

## 2018-05-12 NOTE — Progress Notes (Signed)
Pre visit review using our clinic review tool, if applicable. No additional management support is needed unless otherwise documented below in the visit note. 

## 2018-05-16 DIAGNOSIS — G4733 Obstructive sleep apnea (adult) (pediatric): Secondary | ICD-10-CM | POA: Diagnosis not present

## 2018-05-24 DIAGNOSIS — J069 Acute upper respiratory infection, unspecified: Secondary | ICD-10-CM | POA: Diagnosis not present

## 2018-05-24 DIAGNOSIS — N390 Urinary tract infection, site not specified: Secondary | ICD-10-CM | POA: Diagnosis not present

## 2018-06-04 ENCOUNTER — Encounter: Payer: Self-pay | Admitting: Gastroenterology

## 2018-06-04 ENCOUNTER — Other Ambulatory Visit: Payer: Self-pay

## 2018-06-04 ENCOUNTER — Ambulatory Visit (INDEPENDENT_AMBULATORY_CARE_PROVIDER_SITE_OTHER): Payer: BLUE CROSS/BLUE SHIELD | Admitting: Gastroenterology

## 2018-06-04 VITALS — BP 124/88 | HR 82 | Resp 17 | Ht 64.0 in | Wt 197.2 lb

## 2018-06-04 DIAGNOSIS — K287 Chronic gastrojejunal ulcer without hemorrhage or perforation: Secondary | ICD-10-CM

## 2018-06-04 DIAGNOSIS — R5383 Other fatigue: Secondary | ICD-10-CM | POA: Diagnosis not present

## 2018-06-04 NOTE — Progress Notes (Signed)
Open  Cephas Darby, MD [address]  [place]  [place], [place] [address]  Main: [phone]  Fax: [phone]    Gastroenterology Consultation  Referring Provider:     McLean-Scocuzza, [name] * Primary Care Physician:  McLean-Scocuzza, [name], MD Primary Gastroenterologist:  Dr. Cephas Darby Reason for Consultation:   chronic epigastric pain, anastomotic ulcer        HPI:   Adrienne Savage is a 48 y.o. female referred by Dr. Terese Door, [name], MD  for consultation & management of colon cancer screening and chronic epigastric pain.  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  She lost about 64 pounds since then.  She reports that, since surgery she has been experiencing chronic epigastric pain.  Underwent 2 upper endoscopies by her bariatric surgeon.  She was told that she had anastomotic ulcer.  She has been taking Protonix 40 mg daily in addition to Pepcid and sucralfate.  She went to ER in 10/2017 secondary to left upper quadrant discomfort, underwent CT which revealed possible gastric thickening and soft tissue edema.  She has been treated as possible gastritis by her bariatric surgeon.  And scheduled for upper endoscopy next week.  Patient denies any other upper GI symptoms.  She recently quit smoking and on Chantix.  She is also due for colonoscopy for colon cancer screening.  She denies any lower GI symptoms.  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  I started her on omeprazole 40 mg twice daily.  Patient reports moderate improvement in her symptoms.  She continues to stay away from tobacco.  Follow-up visit 06/04/2018 She reports feeling lethargic, denies black stools.  Otherwise, denies epigastric pain.  Continues to gain weight.  She reports eating more red meat and high carb foods, less physically active.  She is taking bariatric multivitamin with minerals daily.  Her PCP ordered basic  labs including iron studies, TSH last month.  She reported she is going to get them done next week.  She is not smoking.  NSAIDs: None  Antiplts/Anticoagulants/Anti thrombotics: None  GI Procedures:  Upper endoscopy 02/08/2018 - Roux-en-Y gastrojejunostomy with gastrojejunal anastomosis characterized by ulceration. - Normal gastric body. - Normal gastroesophageal junction and esophagus. - No specimens collected.  Colonoscopy 02/08/2018 - The entire examined colon is normal. - The distal rectum and anal verge are normal on retroflexion view. - No specimens collected.  Upper endoscopy in 06/2017, report not available  Upper endoscopy at [facility] on 07/18/2016, report not available Pathology A. 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 No Helicobacter pylori organisms are seen on routine H&E stain. No intestinal metaplasia, dysplasia, or carcinoma is seen.  Past Medical History:  Diagnosis Date  . Allergy   . Anxiety   . Colon cancer screening   . Depression   . Eczema   . Fatigue   . Frequent headaches   . Gastric ulcer   . GERD (gastroesophageal reflux disease)   . Goiter   . History of chicken pox   . HPV in female   . Hypertension   . Insomnia   . Leg pain   . Snoring   . UTI (urinary tract infection)     Past Surgical History:  Procedure Laterality Date  . CESAREAN SECTION  2008  . COLONOSCOPY WITH PROPOFOL N/A 02/08/2018   Procedure: COLONOSCOPY WITH PROPOFOL;  Surgeon:  Lin Landsman, MD;  Location: [facility] ENDOSCOPY;  Service: Gastroenterology;  Laterality: N/A;  . CYSTECTOMY     cyst on lip  . ESOPHAGOGASTRODUODENOSCOPY (EGD) WITH PROPOFOL N/A 02/08/2018   Procedure: ESOPHAGOGASTRODUODENOSCOPY (EGD) WITH PROPOFOL;  Surgeon: Lin Landsman, MD;  Location: [place];  Service: Gastroenterology;  Laterality: N/A;  . GASTRIC BYPASS     8/18  . MASS  EXCISION  06/23/2011   Procedure: EXCISION MASS;  Surgeon: Wynonia Sours, MD;  Location: [place];  Service: Orthopedics;  Laterality: Left;  left thumb and left index finger  . MULTIPLE TOOTH EXTRACTIONS  2/12  . ROTATOR CUFF REPAIR     right 09/2017 tear repair     Current Outpatient Medications:  .  ALPRAZolam (XANAX) 0.5 MG tablet, Take 0.5 mg by mouth 2 (two) times daily as needed. , Disp: , Rfl:  .  amLODipine (NORVASC) 5 MG tablet, Take 1 tablet (5 mg total) by mouth daily., Disp: 90 tablet, Rfl: 3 .  CALCIUM-VITAMIN D PO, Take by mouth 2 (two) times daily., Disp: , Rfl:  .  hydrochlorothiazide (HYDRODIURIL) 12.5 MG tablet, Take 1 tablet (12.5 mg total) by mouth daily. In am, Disp: 90 tablet, Rfl: 3 .  latanoprost (XALATAN) 0.005 % ophthalmic solution, INSTILL 1 DROP(S) IN EACH EYE ONCE DAILY AT BEDTIME, Disp: , Rfl: 6 .  Multiple Vitamin (MULTI-VITAMINS) TABS, Take by mouth., Disp: , Rfl:  .  SUMAtriptan (IMITREX) 50 MG tablet, May repeat in 2 hours if headache persists or recurs. Max 4 pills in 24 hours, Disp: 10 tablet, Rfl: 2 .  varenicline (CHANTIX) 1 MG tablet, Take 1 tablet (1 mg total) by mouth 2 (two) times daily., Disp: 180 tablet, Rfl: 0 .  Varenicline Tartrate (CHANTIX PO), Chantix Starting Month Box 0.5 mg (11)-1 mg (42) tablets in dose pack, Disp: , Rfl:  .  montelukast (SINGULAIR) 10 MG tablet, Take 1 tablet (10 mg total) by mouth at bedtime. (Patient not taking: Reported on 06/04/2018), Disp: 90 tablet, Rfl: 3 .  nitrofurantoin (MACRODANTIN) 100 MG capsule, TAKE 1 CAPSULE BY MOUTH EVERY 12 HOURS, Disp: , Rfl:  .  omeprazole (PRILOSEC) 40 MG capsule, Take 1 capsule (40 mg total) by mouth 2 (two) times daily before a meal., Disp: 180 capsule, Rfl: 0 .  traZODone (DESYREL) 100 MG tablet, Take 100 mg by mouth at bedtime. , Disp: , Rfl: 0 .  varenicline (CHANTIX) 1 MG tablet, Chantix Continuing Month Box 1 mg tablet, Disp: , Rfl:  .  Vilazodone HCl (VIIBRYD) 20 MG  TABS, Take 20 mg by mouth daily., Disp: , Rfl:   Current Facility-Administered Medications:  .  betamethasone acetate-betamethasone sodium phosphate (CELESTONE) injection 3 mg, 3 mg, Intramuscular, Once, Edrick Kins, DPM   Family History  Problem Relation Age of Onset  . Heart disease Father   . Glaucoma Father   . Thyroid disease Mother   . Hypertension Mother   . Alcohol abuse Mother   . Depression Mother   . Prostate cancer Maternal Grandfather   . Asthma Son   . Learning disabilities Son      Social History   Tobacco Use  . Smoking status: Former Smoker    Packs/day: 0.50    Types: Cigarettes    Last attempt to quit: 04/2015    Years since quitting: 3.1  . Smokeless tobacco: Never Used  Substance Use Topics  . Alcohol use: Yes    Alcohol/week: 7.0 standard drinks  Types: 7 Standard drinks or equivalent per week    Comment: daily wine  . Drug use: Not Currently    Allergies as of 06/04/2018 - Review Complete 06/04/2018  Allergen Reaction Noted  . Cyclinex [tetracycline hcl] Shortness Of Breath 06/16/2011  . Demeclocycline Shortness Of Breath 06/16/2011  . Doxycycline Shortness Of Breath 06/16/2011  . Minocycline Shortness Of Breath 06/16/2011  . Tetracycline Shortness Of Breath 09/04/2015  . Tetracyclines & related Shortness Of Breath 06/16/2011    Review of Systems:    All systems reviewed and negative except where noted in HPI.   Physical Exam:  BP 124/88 (BP Location: Left Arm, Patient Position: Sitting, Cuff Size: Large)   Pulse 82   Resp 17   Ht 5\' 4"  (1.626 m)   Wt 197 lb 3.2 oz (89.4 kg)   BMI 33.85 kg/m  No LMP recorded. (Menstrual status: Irregular Periods).  General:   Alert,  Well-developed, well-nourished, pleasant and cooperative in NAD Head:  Normocephalic and atraumatic. Eyes:  Sclera clear, no icterus.   Conjunctiva pink. Ears:  Normal auditory acuity. Nose:  No deformity, discharge, or lesions. Mouth:  No deformity or  lesions,oropharynx pink & moist. Neck:  Supple; no masses or thyromegaly. Lungs:  Respirations even and unlabored.  Clear throughout to auscultation.   No wheezes, crackles, or rhonchi. No acute distress. Heart:  Regular rate and rhythm; no murmurs, clicks, rubs, or gallops. Abdomen:  Normal bowel sounds. Soft, mild epigastric tenderness and non-distended without masses, hepatosplenomegaly or hernias noted.  No guarding or rebound tenderness.   Rectal: Not performed Msk:  Symmetrical without gross deformities. Good, equal movement & strength bilaterally. Pulses:  Normal pulses noted. Extremities:  No clubbing or edema.  No cyanosis. Neurologic:  Alert and oriented x3;  grossly normal neurologically. Skin:  Intact without significant lesions or rashes. No jaundice. Psych:  Alert and cooperative. Normal mood and affect.  Imaging Studies: Reviewed  Assessment and Plan:   Lanett Lasorsa is a 48 y.o. African-American female with history of Roux-en-Y gastric bypass in 10/2016 at [facility] here for follow-up of chronic epigastric pain from the anastomotic ulcer.  Epigastric pain has resolved  Gastrojejunal anastomotic ulcer, asymptomatic at present Decrease to omeprazole 40 mg once daily for 4 weeks and then to 20 mg once a day Continue to remain abstinent from smoking Currently on Chantix  Lethargy: Will follow-up on the labs ordered by her PCP  Colon cancer screening: Normal colonoscopy, recommend surveillance in 2029   Follow up in 6 months   Cephas Darby, MD

## 2018-06-10 ENCOUNTER — Ambulatory Visit: Payer: Self-pay | Admitting: Neurology

## 2018-06-14 DIAGNOSIS — G4733 Obstructive sleep apnea (adult) (pediatric): Secondary | ICD-10-CM | POA: Diagnosis not present

## 2018-07-28 ENCOUNTER — Encounter: Payer: Self-pay | Admitting: Neurology

## 2018-08-03 ENCOUNTER — Other Ambulatory Visit: Payer: Self-pay

## 2018-08-03 ENCOUNTER — Emergency Department
Admission: EM | Admit: 2018-08-03 | Discharge: 2018-08-03 | Disposition: A | Discharge status: Other Acute Inpatient Hospital | Payer: BLUE CROSS/BLUE SHIELD | Attending: Emergency Medicine | Admitting: Emergency Medicine

## 2018-08-03 ENCOUNTER — Emergency Department: Payer: BLUE CROSS/BLUE SHIELD

## 2018-08-03 ENCOUNTER — Encounter: Payer: Self-pay | Admitting: Emergency Medicine

## 2018-08-03 ENCOUNTER — Telehealth: Payer: Self-pay | Admitting: Gastroenterology

## 2018-08-03 DIAGNOSIS — Z1159 Encounter for screening for other viral diseases: Secondary | ICD-10-CM | POA: Diagnosis not present

## 2018-08-03 DIAGNOSIS — R1084 Generalized abdominal pain: Secondary | ICD-10-CM | POA: Diagnosis not present

## 2018-08-03 DIAGNOSIS — Z9884 Bariatric surgery status: Secondary | ICD-10-CM | POA: Diagnosis not present

## 2018-08-03 DIAGNOSIS — I10 Essential (primary) hypertension: Secondary | ICD-10-CM | POA: Insufficient documentation

## 2018-08-03 DIAGNOSIS — Z743 Need for continuous supervision: Secondary | ICD-10-CM | POA: Diagnosis not present

## 2018-08-03 DIAGNOSIS — Z881 Allergy status to other antibiotic agents status: Secondary | ICD-10-CM | POA: Diagnosis not present

## 2018-08-03 DIAGNOSIS — G43909 Migraine, unspecified, not intractable, without status migrainosus: Secondary | ICD-10-CM | POA: Diagnosis not present

## 2018-08-03 DIAGNOSIS — R1013 Epigastric pain: Secondary | ICD-10-CM

## 2018-08-03 DIAGNOSIS — K561 Intussusception: Secondary | ICD-10-CM | POA: Diagnosis not present

## 2018-08-03 DIAGNOSIS — R279 Unspecified lack of coordination: Secondary | ICD-10-CM | POA: Diagnosis not present

## 2018-08-03 DIAGNOSIS — Z87891 Personal history of nicotine dependence: Secondary | ICD-10-CM | POA: Diagnosis not present

## 2018-08-03 DIAGNOSIS — K56609 Unspecified intestinal obstruction, unspecified as to partial versus complete obstruction: Secondary | ICD-10-CM | POA: Diagnosis not present

## 2018-08-03 DIAGNOSIS — Z8719 Personal history of other diseases of the digestive system: Secondary | ICD-10-CM | POA: Diagnosis not present

## 2018-08-03 DIAGNOSIS — R1012 Left upper quadrant pain: Secondary | ICD-10-CM | POA: Diagnosis not present

## 2018-08-03 DIAGNOSIS — R109 Unspecified abdominal pain: Secondary | ICD-10-CM | POA: Diagnosis not present

## 2018-08-03 DIAGNOSIS — R1011 Right upper quadrant pain: Secondary | ICD-10-CM | POA: Diagnosis not present

## 2018-08-03 DIAGNOSIS — Z79899 Other long term (current) drug therapy: Secondary | ICD-10-CM | POA: Insufficient documentation

## 2018-08-03 DIAGNOSIS — M1990 Unspecified osteoarthritis, unspecified site: Secondary | ICD-10-CM | POA: Diagnosis not present

## 2018-08-03 HISTORY — DX: Unspecified asthma, uncomplicated: J45.909

## 2018-08-03 LAB — COMPREHENSIVE METABOLIC PANEL
ALT: 12 U/L (ref 0–44)
AST: 17 U/L (ref 15–41)
Albumin: 4.2 g/dL (ref 3.5–5.0)
Alkaline Phosphatase: 128 U/L — ABNORMAL HIGH (ref 38–126)
Anion gap: 10 (ref 5–15)
BUN: 11 mg/dL (ref 6–20)
CO2: 28 mmol/L (ref 22–32)
Calcium: 9 mg/dL (ref 8.9–10.3)
Chloride: 103 mmol/L (ref 98–111)
Creatinine, Ser: 0.62 mg/dL (ref 0.44–1.00)
GFR calc Af Amer: >60 mL/min (ref >60)
GFR calc non Af Amer: >60 mL/min (ref >60)
Glucose, Bld: 101 mg/dL — ABNORMAL HIGH (ref 70–99)
Potassium: 3.8 mmol/L (ref 3.5–5.1)
Sodium: 141 mmol/L (ref 135–145)
Total Bilirubin: 0.6 mg/dL (ref 0.3–1.2)
Total Protein: 7.8 g/dL (ref 6.5–8.1)

## 2018-08-03 LAB — CBC
HCT: 39.7 % (ref 36.0–46.0)
Hemoglobin: 12.5 g/dL (ref 12.0–15.0)
MCH: 28.1 pg (ref 26.0–34.0)
MCHC: 31.5 g/dL (ref 30.0–36.0)
MCV: 89.2 fL (ref 80.0–100.0)
Platelets: 264 10*3/uL (ref 150–400)
RBC: 4.45 MIL/uL (ref 3.87–5.11)
RDW: 12.7 % (ref 11.5–15.5)
WBC: 6.8 10*3/uL (ref 4.0–10.5)
nRBC: 0 % (ref 0.0–0.2)

## 2018-08-03 LAB — LIPASE, BLOOD: Lipase: 24 U/L (ref 11–51)

## 2018-08-03 MED ORDER — MORPHINE SULFATE (PF) 4 MG/ML IV SOLN
4.0000 mg | Freq: Once | INTRAVENOUS | Status: AC
  Administered 2018-08-03: 4 mg via INTRAVENOUS
  Filled 2018-08-03: qty 1

## 2018-08-03 MED ORDER — FENTANYL CITRATE (PF) 100 MCG/2ML IJ SOLN
50.0000 ug | INTRAMUSCULAR | Status: DC | PRN
  Administered 2018-08-03: 50 ug via INTRAVENOUS
  Filled 2018-08-03: qty 2

## 2018-08-03 MED ORDER — ONDANSETRON HCL 4 MG/2ML IJ SOLN
4.0000 mg | Freq: Once | INTRAMUSCULAR | Status: AC
  Administered 2018-08-03: 4 mg via INTRAVENOUS
  Filled 2018-08-03: qty 2

## 2018-08-03 MED ORDER — DIPHENHYDRAMINE HCL 50 MG/ML IJ SOLN
25.0000 mg | Freq: Once | INTRAMUSCULAR | Status: AC
  Administered 2018-08-03: 25 mg via INTRAVENOUS
  Filled 2018-08-03: qty 1

## 2018-08-03 MED ORDER — IOHEXOL 300 MG/ML  SOLN
100.0000 mL | Freq: Once | INTRAMUSCULAR | Status: AC | PRN
  Administered 2018-08-03: 100 mL via INTRAVENOUS

## 2018-08-03 MED ORDER — HYDROMORPHONE HCL 1 MG/ML IJ SOLN
1.0000 mg | Freq: Once | INTRAMUSCULAR | Status: AC
  Administered 2018-08-03: 1 mg via INTRAVENOUS
  Filled 2018-08-03: qty 1

## 2018-08-03 MED ORDER — SODIUM CHLORIDE 0.9 % IV BOLUS
1000.0000 mL | Freq: Once | INTRAVENOUS | Status: AC
  Administered 2018-08-03: 18:00:00 1000 mL via INTRAVENOUS

## 2018-08-03 NOTE — Telephone Encounter (Signed)
Please advise to previous message, pt will not answer private calls

## 2018-08-03 NOTE — ED Triage Notes (Signed)
Pt states she woke up at 0300 this am with sharp epigastric pain, hx of ulcer, post gastric bypass in 2018 and states concern it might be a gas pocket, does have gall bladder, had cube steak last night and feels it didn't digest well. Tearful in triage and pacing the floor.

## 2018-08-03 NOTE — ED Notes (Addendum)
Report called to [facility], bed 7132 at [facility]; secretary notified to call for transport

## 2018-08-03 NOTE — ED Notes (Signed)
Pt c/o onset generalized itching; no rashes or hives noted; pt concerned she is having a rx to pain med given earlier (Dilaudid); MD notified and benadryl ordered for symptoms

## 2018-08-03 NOTE — ED Notes (Signed)
Pt uprite on stretcher in exam room with no distress noted; vs updated; pt reports pain decreased at present 4/10; denies any other c/o; pt requesting something to eat; instr to remain NPO until further updated by MD; pt voices understanding

## 2018-08-03 NOTE — ED Notes (Signed)
Mother called and updated per pt request.

## 2018-08-03 NOTE — ED Provider Notes (Signed)
[facility] Emergency Department Provider Note   ____________________________________________   I have reviewed the triage vital signs and the nursing notes.   HISTORY  Chief Complaint Abdominal pain  History limited by: Not Limited   HPI Adrienne Savage is a 48 y.o. female who presents to the emergency department today because of concern for abdominal pain. Located in the epigastric region. Started roughly 12 hours ago. Has been severe and constant since then. She did start having some nausea once she presented to the emergency department. She denies any pain or discomfort yesterday. Has never had pain similar to this. Had gastric bypass in 2018. Denies any change in urine or bowel movements. Denies any fevers.   Records reviewed. Per medical record review patient has a history of gastric bypass.   Past Medical History:  Diagnosis Date  . Allergy   . Anxiety   . Colon cancer screening   . Depression   . Eczema   . Fatigue   . Frequent headaches   . Gastric ulcer   . GERD (gastroesophageal reflux disease)   . Goiter   . History of chicken pox   . HPV in female   . Hypertension   . Insomnia   . Leg pain   . Snoring   . UTI (urinary tract infection)     Patient Active Problem List   Diagnosis Date Noted  . 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  . Abdominal pain, chronic, epigastric   . Insomnia 12/22/2017  . Migraine 12/17/2017  . Hot flashes 12/17/2017  . Multiple thyroid nodules 12/17/2017  . Environmental allergies 12/17/2017  . Chronic sinusitis 12/17/2017  . 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 Obesity 01/15/2017  . Smoker 01/15/2017  . Morbid obesity with BMI of 40.0-44.9, adult ([place]) 07/18/2016  . Anxiety 05/27/2016  . Osteoarthritis of knee 05/27/2016  . Essential hypertension 09/04/2015  . Obesity with body mass index 30 or greater 08/26/2011    Past Surgical  History:  Procedure Laterality Date  . CESAREAN SECTION  2008  . COLONOSCOPY WITH PROPOFOL N/A 02/08/2018   Procedure: COLONOSCOPY WITH PROPOFOL;  Surgeon: Lin Landsman, MD;  Location: [facility] ENDOSCOPY;  Service: Gastroenterology;  Laterality: N/A;  . CYSTECTOMY     cyst on lip  . ESOPHAGOGASTRODUODENOSCOPY (EGD) WITH PROPOFOL N/A 02/08/2018   Procedure: ESOPHAGOGASTRODUODENOSCOPY (EGD) WITH PROPOFOL;  Surgeon: Lin Landsman, MD;  Location: [place];  Service: Gastroenterology;  Laterality: N/A;  . GASTRIC BYPASS     8/18  . MASS EXCISION  06/23/2011   Procedure: EXCISION MASS;  Surgeon: Wynonia Sours, MD;  Location: [place];  Service: Orthopedics;  Laterality: Left;  left thumb and left index finger  . MULTIPLE TOOTH EXTRACTIONS  2/12  . ROTATOR CUFF REPAIR     right 09/2017 tear repair     Prior to Admission medications   Medication Sig Start Date End Date Taking? Authorizing Provider  ALPRAZolam Duanne Moron) 0.5 MG tablet Take 0.5 mg by mouth 2 (two) times daily as needed.  12/07/17   [provider]  amLODipine (NORVASC) 5 MG tablet Take 1 tablet (5 mg total) by mouth daily. 05/12/18   McLean-Scocuzza, [name], MD  CALCIUM-VITAMIN D PO Take by mouth 2 (two) times daily.    [provider]  hydrochlorothiazide (HYDRODIURIL) 12.5 MG tablet Take 1 tablet (12.5 mg total) by mouth daily. In am 05/12/18   McLean-Scocuzza,  [name], MD  latanoprost (XALATAN) 0.005 % ophthalmic solution INSTILL 1 DROP(S) IN [facility] EYE ONCE DAILY AT BEDTIME 02/04/18   [provider]  montelukast (SINGULAIR) 10 MG tablet Take 1 tablet (10 mg total) by mouth at bedtime. Patient not taking: Reported on 06/04/2018 12/17/17   McLean-Scocuzza, [name], MD  Multiple Vitamin (MULTI-VITAMINS) TABS Take by mouth.    [provider]  nitrofurantoin (MACRODANTIN) 100 MG capsule TAKE 1 CAPSULE BY MOUTH EVERY 12 HOURS 05/24/18   [provider]  omeprazole  (PRILOSEC) 40 MG capsule Take 1 capsule (40 mg total) by mouth 2 (two) times daily before a meal. 02/08/18 05/09/18  Vanga, Tally Due, MD  SUMAtriptan (IMITREX) 50 MG tablet May repeat in 2 hours if headache persists or recurs. Max 4 pills in 24 hours 12/17/17   McLean-Scocuzza, [name], MD  traZODone (DESYREL) 100 MG tablet Take 100 mg by mouth at bedtime.  10/20/17   [provider]  varenicline (CHANTIX) 1 MG tablet Take 1 tablet (1 mg total) by mouth 2 (two) times daily. 03/23/18   McLean-Scocuzza, [name], MD  varenicline (CHANTIX) 1 MG tablet Chantix Continuing Month Box 1 mg tablet    [provider]  Varenicline Tartrate (CHANTIX PO) Chantix Starting Month Box 0.5 mg (11)-1 mg (42) tablets in dose pack    [provider]  Vilazodone HCl (VIIBRYD) 20 MG TABS Take 20 mg by mouth daily.    [provider]    Allergies Cyclinex [tetracycline hcl]; Demeclocycline; Doxycycline; Minocycline; Tetracycline; and Tetracyclines & related  Family History  Problem Relation Age of Onset  . Heart disease Father   . Glaucoma Father   . Thyroid disease Mother   . Hypertension Mother   . Alcohol abuse Mother   . Depression Mother   . Prostate cancer Maternal Grandfather   . Asthma Son   . Learning disabilities Son     Social History Social History   Tobacco Use  . Smoking status: Former Smoker    Packs/day: 0.50    Types: Cigarettes    Last attempt to quit: 04/2015    Years since quitting: 3.3  . Smokeless tobacco: Never Used  Substance Use Topics  . Alcohol use: Yes    Alcohol/week: 7.0 standard drinks    Types: 7 Standard drinks or equivalent per week    Comment: daily wine  . Drug use: Not Currently    Review of Systems Constitutional: No fever/chills Eyes: No visual changes. ENT: No sore throat. Cardiovascular: Denies chest pain. Respiratory: Denies shortness of breath. Gastrointestinal: Positive for abdominal pain.   Genitourinary:  Negative for dysuria. Musculoskeletal: Negative for back pain. Skin: Negative for rash. Neurological: Negative for headaches, focal weakness or numbness.  ____________________________________________   PHYSICAL EXAM:  VITAL SIGNS: ED Triage Vitals  Enc Vitals Group     BP 08/03/18 1427 (!) 154/100     Pulse Rate 08/03/18 1427 84     Resp 08/03/18 1427 18     Temp 08/03/18 1427 98.2 F (36.8 C)     Temp src --      SpO2 08/03/18 1427 100 %     Weight 08/03/18 1429 190 lb (86.2 kg)     Height 08/03/18 1429 5\' 4"  (1.626 m)     Head Circumference --      Peak Flow --      Pain Score 08/03/18 1438 10   Constitutional: Alert and oriented.  Eyes: Conjunctivae are normal.  ENT      Head: Normocephalic and atraumatic.      Nose: No congestion/rhinnorhea.      Mouth/Throat: Mucous membranes are moist.      Neck: No stridor. Hematological/Lymphatic/Immunilogical: No cervical lymphadenopathy. Cardiovascular: Normal rate, regular rhythm.  No murmurs, rubs, or gallops.  Respiratory: Normal respiratory effort without tachypnea nor retractions. Breath sounds are clear and equal bilaterally. No wheezes/rales/rhonchi. Gastrointestinal: Soft and tender to palpation in the epigastric region.  Genitourinary: Deferred Musculoskeletal: Normal range of motion in all extremities. No lower extremity edema. Neurologic:  Normal speech and language. No gross focal neurologic deficits are appreciated.  Skin:  Skin is warm, dry and intact. No rash noted. Psychiatric: Mood and affect are normal. Speech and behavior are normal. Patient exhibits appropriate insight and judgment.  ____________________________________________    LABS (pertinent positives/negatives)  CMP wnl except glu 101 CBC wbc 6.63, hgb 12.7, plt 245 Lipase 24 ____________________________________________   EKG  I, Nance Pear, attending physician, personally viewed and interpreted this EKG  EKG Time: 1427 Rate:  75 Rhythm: normal sinus rhythm Axis: normal Intervals: qtc 462 QRS: narrow, q waves v1, v2, v3 ST changes: no st elevation Impression: abnormal ekg  ____________________________________________    RADIOLOGY  CT abd/pel Concern for intussusception of small bowel near site of small bowel anastomosis.   ____________________________________________   PROCEDURES  Procedures  ____________________________________________   INITIAL IMPRESSION / ASSESSMENT AND PLAN / ED COURSE  Pertinent labs & imaging results that were available during my care of the patient were reviewed by me and considered in my medical decision making (see chart for details).   Patient presented to the emergency department today because of concern for abdominal pain. Patient with history of roux en y. Was tender to palpation in the epigastric region. CT scan was obtained which was concerning for intussusception. Discussed with bariatric surgery at [facility] who accepted patient in transfer. Discussed findings and plan with patient.   ___________________________________________   FINAL CLINICAL IMPRESSION(S) / ED DIAGNOSES  Final diagnoses:  Epigastric pain  Intussusception ([place])     Note: This dictation was prepared with Dragon dictation. Any transcriptional errors that result from this process are unintentional     Nance Pear, MD 08/03/18 [phone]

## 2018-08-03 NOTE — ED Notes (Signed)
EMS called and spoke with Lovena Le.

## 2018-08-03 NOTE — Telephone Encounter (Signed)
She woke up with severe epigastric pain, sharp, constant and now radiating to the back. She denies f/c/n/v/diarrhea. She had steak and mash potatoes last night and has been having few glasses of wine last few days. She has intact gallbladder and has history of gastrojejunal anastomotic ulcer.  She is taking omeprazole 40 mg twice daily  I advised her to go to ER but she did not want to.  She wanted to get labs first, ordered CBC, lipase, CMP.  She will go to [facility] lab now.  Advised her to take only liquids.  If her symptoms are worse, told her to go to the ER  Patient expressed understanding of the plan I will cancel her tele-visit for tomorrow  Cephas Darby, MD [place]  [place], [place] [address]  Main: [phone]  Fax: [phone] Pager: [phone]

## 2018-08-03 NOTE — ED Notes (Signed)
Patient transported to CT 

## 2018-08-03 NOTE — Telephone Encounter (Signed)
Pt is calling she states she is a pt of Dr. Marius Ditch and is experiencing severe back pain that travels to her abdominal  She feels gassy and also has an Ulcer pt is scheduled for Virtual visit 08/04/18 and would like a call from Nurse asap she is aware nurse will call from Private# she states please leave detailed vm  If she does not answer

## 2018-08-03 NOTE — ED Notes (Signed)
Report given to [facility], [place] [phone])

## 2018-08-03 NOTE — ED Notes (Signed)
EMTALA checked for completion  

## 2018-08-04 ENCOUNTER — Ambulatory Visit: Payer: BLUE CROSS/BLUE SHIELD | Admitting: Gastroenterology

## 2018-08-04 ENCOUNTER — Other Ambulatory Visit: Payer: Self-pay

## 2018-08-04 DIAGNOSIS — R1013 Epigastric pain: Secondary | ICD-10-CM | POA: Diagnosis not present

## 2018-08-04 DIAGNOSIS — Z9884 Bariatric surgery status: Secondary | ICD-10-CM | POA: Diagnosis not present

## 2018-08-04 DIAGNOSIS — K56609 Unspecified intestinal obstruction, unspecified as to partial versus complete obstruction: Secondary | ICD-10-CM | POA: Diagnosis not present

## 2018-08-04 LAB — URINALYSIS, COMPLETE (UACMP) WITH MICROSCOPIC
Bacteria, UA: NONE SEEN
Bilirubin Urine: NEGATIVE
Glucose, UA: NEGATIVE mg/dL
Hgb urine dipstick: NEGATIVE
Ketones, ur: NEGATIVE mg/dL
Leukocytes,Ua: NEGATIVE
Nitrite: NEGATIVE
Protein, ur: NEGATIVE mg/dL
Specific Gravity, Urine: >1.046 — ABNORMAL HIGH (ref 1.005–1.030)
pH: 7 (ref 5.0–8.0)

## 2018-08-04 NOTE — Telephone Encounter (Signed)
Labs have been released and pt has been notified

## 2018-08-05 DIAGNOSIS — K56609 Unspecified intestinal obstruction, unspecified as to partial versus complete obstruction: Secondary | ICD-10-CM | POA: Diagnosis not present

## 2018-08-05 DIAGNOSIS — R1013 Epigastric pain: Secondary | ICD-10-CM | POA: Diagnosis not present

## 2018-08-05 DIAGNOSIS — Z9884 Bariatric surgery status: Secondary | ICD-10-CM | POA: Diagnosis not present

## 2018-08-06 DIAGNOSIS — K561 Intussusception: Secondary | ICD-10-CM | POA: Diagnosis not present

## 2018-08-14 DIAGNOSIS — G4733 Obstructive sleep apnea (adult) (pediatric): Secondary | ICD-10-CM | POA: Diagnosis not present

## 2018-08-16 DIAGNOSIS — F419 Anxiety disorder, unspecified: Secondary | ICD-10-CM | POA: Diagnosis not present

## 2018-08-16 DIAGNOSIS — F329 Major depressive disorder, single episode, unspecified: Secondary | ICD-10-CM | POA: Diagnosis not present

## 2018-08-26 ENCOUNTER — Other Ambulatory Visit: Payer: BLUE CROSS/BLUE SHIELD

## 2018-09-10 ENCOUNTER — Encounter: Payer: Self-pay | Admitting: Neurology

## 2018-09-14 DIAGNOSIS — G4733 Obstructive sleep apnea (adult) (pediatric): Secondary | ICD-10-CM | POA: Diagnosis not present

## 2018-09-15 ENCOUNTER — Telehealth: Payer: Self-pay | Admitting: Gastroenterology

## 2018-09-15 NOTE — Telephone Encounter (Signed)
Patient called & l/m on v/m stating she would like & appointment with Dr Marius Ditch. She was sent to [facility] & she requested we  That we send an email  Record request to her email(in system). I explained we are not able to she would need to come here & sign or check with [facility] on their policy.

## 2018-09-16 ENCOUNTER — Ambulatory Visit: Payer: BLUE CROSS/BLUE SHIELD | Admitting: Gastroenterology

## 2018-09-16 ENCOUNTER — Other Ambulatory Visit: Payer: Self-pay

## 2018-09-16 ENCOUNTER — Encounter: Payer: Self-pay | Admitting: Gastroenterology

## 2018-09-16 VITALS — BP 130/86 | HR 71 | Temp 98.3°F | Resp 17 | Ht 64.0 in | Wt 190.2 lb

## 2018-09-16 DIAGNOSIS — Z9884 Bariatric surgery status: Secondary | ICD-10-CM | POA: Diagnosis not present

## 2018-09-16 DIAGNOSIS — R140 Abdominal distension (gaseous): Secondary | ICD-10-CM | POA: Diagnosis not present

## 2018-09-16 MED ORDER — METRONIDAZOLE 500 MG PO TABS: 500.0000 mg | ORAL_TABLET | Freq: Two times a day (BID) | ORAL | 0 refills | Status: AC

## 2018-09-16 MED ORDER — CIPROFLOXACIN HCL 500 MG PO TABS: 500.0000 mg | ORAL_TABLET | Freq: Two times a day (BID) | ORAL | 0 refills | Status: AC

## 2018-09-16 NOTE — Progress Notes (Signed)
Open  Adrienne Darby, MD [address]  [place]  [place], [place] [address]  Main: [phone]  Fax: [phone]    Gastroenterology Consultation  Referring Provider:     McLean-Scocuzza, Adrienne Mackie * Primary Care Physician:  Savage, Adrienne Glow, MD Primary Gastroenterologist:  Dr. Cephas Savage Reason for Consultation:   chronic epigastric pain, anastomotic ulcer        HPI:   Adrienne Savage is a 48 y.o. female referred by Dr. Terese Door, Adrienne Glow, MD  for consultation & management of colon cancer screening and chronic epigastric pain.  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  She lost about 64 pounds since then.  She reports that, since surgery she has been experiencing chronic epigastric pain.  Underwent 2 upper endoscopies by her bariatric surgeon.  She was told that she had anastomotic ulcer.  She has been taking Protonix 40 mg daily in addition to Pepcid and sucralfate.  She went to ER in 10/2017 secondary to left upper quadrant discomfort, underwent CT which revealed possible gastric thickening and soft tissue edema.  She has been treated as possible gastritis by her bariatric surgeon.  And scheduled for upper endoscopy next week.  Patient denies any other upper GI symptoms.  She recently quit smoking and on Chantix.  She is also due for colonoscopy for colon cancer screening.  She denies any lower GI symptoms.  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  I started her on omeprazole 40 mg twice daily.  Patient reports moderate improvement in her symptoms.  She continues to stay away from tobacco.  Follow-up visit 06/04/2018 She reports feeling lethargic, denies black stools.  Otherwise, denies epigastric pain.  Continues to gain weight.  She reports eating more red meat and high carb foods, less physically active.  She is taking bariatric multivitamin with minerals daily.  Her PCP ordered basic  labs including iron studies, TSH last month.  She reported she is going to get them done next week.  She is not smoking.  Follow-up visit 09/16/2018 Adrienne Savage was admitted to [facility] in 07/2018 when she presented with acute abdominal pain, initially went to [facility] ER, CT abdomen and pelvis was concerning for small bowel intussusception, therefore she was transferred to [facility].  W, she underwent x-ray upper GI series with small bowel follow-through which did not reveal evidence of intussusception.  She was managed conservatively and her pain thought to be secondary to severe constipation, managed with aggressive bowel regimen and was discharged home.  Since then, patient reports that her pain modestly improved.  She continues to experience sharp/squeezing upper abdominal pain associated with significant bloating.  The pain is not as intense or radiating to the back compared to recent episode. She always has irregular bowel habits and currently taking MiraLAX once a day and Senokot.  She also reports feeling gassy and burping.  She is taking omeprazole 40 mg twice daily  NSAIDs: None  Antiplts/Anticoagulants/Anti thrombotics: None  GI Procedures:  Upper endoscopy 02/08/2018 - Roux-en-Y gastrojejunostomy with gastrojejunal anastomosis characterized by ulceration. - Normal gastric body. - Normal gastroesophageal junction and esophagus. - No specimens collected.  Colonoscopy 02/08/2018 - The entire examined colon is normal. - The distal rectum and anal verge are normal on retroflexion view. - No specimens collected.  Upper endoscopy in 06/2017, report not available  Upper endoscopy at [facility] on 07/18/2016, report not available Pathology A. Esophagus, lower third nodularity, endoscopic  biopsy:  Gastric oxyntic type mucosa with reactive foveolar hyperplasia and chronic gastritis. No Helicobacter pylori organisms are seen on routine H&E stain. No intestinal metaplasia, dysplasia, or carcinoma is seen.  Past  Medical History:  Diagnosis Date  . Allergy   . Anxiety   . Asthma   . Colon cancer screening   . Depression   . Eczema   . Fatigue   . Frequent headaches   . Gastric ulcer   . GERD (gastroesophageal reflux disease)   . Goiter   . History of chicken pox   . HPV in female   . Hypertension   . Insomnia   . Leg pain   . Snoring   . UTI (urinary tract infection)     Past Surgical History:  Procedure Laterality Date  . CESAREAN SECTION  2008  . COLONOSCOPY WITH PROPOFOL N/A 02/08/2018   Procedure: COLONOSCOPY WITH PROPOFOL;  Surgeon: Lin Landsman, MD;  Location: [facility] ENDOSCOPY;  Service: Gastroenterology;  Laterality: N/A;  . CYSTECTOMY     cyst on lip  . ESOPHAGOGASTRODUODENOSCOPY (EGD) WITH PROPOFOL N/A 02/08/2018   Procedure: ESOPHAGOGASTRODUODENOSCOPY (EGD) WITH PROPOFOL;  Surgeon: Lin Landsman, MD;  Location: [place];  Service: Gastroenterology;  Laterality: N/A;  . GASTRIC BYPASS     8/18  . MASS EXCISION  06/23/2011   Procedure: EXCISION MASS;  Surgeon: Wynonia Sours, MD;  Location: [place];  Service: Orthopedics;  Laterality: Left;  left thumb and left index finger  . MULTIPLE TOOTH EXTRACTIONS  2/12  . ROTATOR CUFF REPAIR     right 09/2017 tear repair     Current Outpatient Medications:  .  ALPRAZolam (XANAX) 0.5 MG tablet, Take 0.5 mg by mouth 2 (two) times daily as needed. , Disp: , Rfl:  .  amLODipine (NORVASC) 5 MG tablet, Take 1 tablet (5 mg total) by mouth daily., Disp: 90 tablet, Rfl: 3 .  CALCIUM-VITAMIN D PO, Take by mouth 2 (two) times daily., Disp: , Rfl:  .  [employer] GAS RELIEF 80 MG chewable tablet, TAKE 1 TABLET (80 MG TOTAL) BY MOUTH EVERY 6 (SIX) HOURS AS NEEDED FOR INDIGESTION FOR UP TO 10 DAYS, Disp: , Rfl:  .  [employer] SENNA 8.6 MG tablet, Take 2 tablets by mouth 2 (two) times daily., Disp: , Rfl:  .  FLUoxetine (PROZAC) 20 MG capsule, , Disp: , Rfl:  .  hydrochlorothiazide (HYDRODIURIL) 12.5 MG tablet, Take 1 tablet  (12.5 mg total) by mouth daily. In am, Disp: 90 tablet, Rfl: 3 .  latanoprost (XALATAN) 0.005 % ophthalmic solution, INSTILL 1 DROP(S) IN EACH EYE ONCE DAILY AT BEDTIME, Disp: , Rfl: 6 .  Multiple Vitamin (MULTI-VITAMINS) TABS, Take by mouth., Disp: , Rfl:  .  polyethylene glycol (MIRALAX / GLYCOLAX) 17 g packet, TAKE 1 PACKET (17 G TOTAL) BY MOUTH ONCE DAILY FOR 30 DAYS MIX IN 4 8OUNCES OF FLUID PRIOR TO TAKING, Disp: , Rfl:  .  traMADol (ULTRAM) 50 MG tablet, TAKE 1 TABLET (50 MG TOTAL) BY MOUTH EVERY 4 (FOUR) HOURS AS NEEDED FOR UP TO 5 DAYS, Disp: , Rfl:  .  ciprofloxacin (CIPRO) 500 MG tablet, Take 1 tablet (500 mg total) by mouth 2 (two) times daily for 14 days., Disp: 28 tablet, Rfl: 0 .  CVS BISACODYL 10 MG suppository, PLACE 1 SUPPOSITORY (10 MG TOTAL) RECTALLY ONCE DAILY AS NEEDED FOR CONSTIPATION FOR UP TO 10 DAYS, Disp: , Rfl:  .  CVS MILK OF MAGNESIA 400  MG/5ML suspension, TAKE 15 MLS BY MOUTH ONCE DAILY AS NEEDED FOR CONSTIPATION FOR UP TO 30 DAYS, Disp: , Rfl:  .  doxepin (SINEQUAN) 25 MG capsule, TAKE 1 2 CAPSULES BY MOUTH EVERY DAY AT BEDTIME FOR INSOMNIA, Disp: , Rfl:  .  metroNIDAZOLE (FLAGYL) 500 MG tablet, Take 1 tablet (500 mg total) by mouth 2 (two) times daily for 14 days., Disp: 28 tablet, Rfl: 0 .  montelukast (SINGULAIR) 10 MG tablet, Take 1 tablet (10 mg total) by mouth at bedtime. (Patient not taking: Reported on 06/04/2018), Disp: 90 tablet, Rfl: 3 .  omeprazole (PRILOSEC) 40 MG capsule, Take 1 capsule (40 mg total) by mouth 2 (two) times daily before a meal., Disp: 180 capsule, Rfl: 0 .  SUMAtriptan (IMITREX) 50 MG tablet, May repeat in 2 hours if headache persists or recurs. Max 4 pills in 24 hours (Patient not taking: Reported on 09/16/2018), Disp: 10 tablet, Rfl: 2 .  traZODone (DESYREL) 100 MG tablet, Take 100 mg by mouth at bedtime. , Disp: , Rfl: 0 .  varenicline (CHANTIX) 1 MG tablet, Take 1 tablet (1 mg total) by mouth 2 (two) times daily. (Patient not taking:  Reported on 09/16/2018), Disp: 180 tablet, Rfl: 0 .  varenicline (CHANTIX) 1 MG tablet, Chantix Continuing Month Box 1 mg tablet, Disp: , Rfl:  .  Varenicline Tartrate (CHANTIX PO), Chantix Starting Month Box 0.5 mg (11)-1 mg (42) tablets in dose pack, Disp: , Rfl:  .  Vilazodone HCl (VIIBRYD) 20 MG TABS, Take 20 mg by mouth daily., Disp: , Rfl:   Current Facility-Administered Medications:  .  betamethasone acetate-betamethasone sodium phosphate (CELESTONE) injection 3 mg, 3 mg, Intramuscular, Once, Edrick Kins, DPM   Family History  Problem Relation Age of Onset  . Heart disease Father   . Glaucoma Father   . Thyroid disease Mother   . Hypertension Mother   . Alcohol abuse Mother   . Depression Mother   . Prostate cancer Maternal Grandfather   . Asthma Son   . Learning disabilities Son      Social History   Tobacco Use  . Smoking status: Former Smoker    Packs/day: 0.50    Types: Cigarettes    Quit date: 04/2015    Years since quitting: 3.4  . Smokeless tobacco: Never Used  Substance Use Topics  . Alcohol use: Yes    Alcohol/week: 7.0 standard drinks    Types: 7 Standard drinks or equivalent per week    Comment: daily wine  . Drug use: Not Currently    Allergies as of 09/16/2018 - Review Complete 09/16/2018  Allergen Reaction Noted  . Cyclinex [tetracycline hcl] Shortness Of Breath 06/16/2011  . Demeclocycline Shortness Of Breath 06/16/2011  . Doxycycline Shortness Of Breath 06/16/2011  . Minocycline Shortness Of Breath 06/16/2011  . Tetracycline Shortness Of Breath 09/04/2015  . Tetracyclines & related Shortness Of Breath 06/16/2011    Review of Systems:    All systems reviewed and negative except where noted in HPI.   Physical Exam:  BP 130/86 (BP Location: Left Arm, Patient Position: Sitting, Cuff Size: Large)   Pulse 71   Temp 98.3 F (36.8 C)   Resp 17   Ht 5\' 4"  (1.626 m)   Wt 190 lb 3.2 oz (86.3 kg)   LMP 10/08/2016 (Exact Date) Comment: neg preg  test 2/27  BMI 32.65 kg/m  Patient's last menstrual period was 10/08/2016 (exact date).  General:   Alert,  Well-developed, well-nourished, pleasant  and cooperative in NAD Head:  Normocephalic and atraumatic. Eyes:  Sclera clear, no icterus.   Conjunctiva pink. Ears:  Normal auditory acuity. Nose:  No deformity, discharge, or lesions. Mouth:  No deformity or lesions,oropharynx pink & moist. Neck:  Supple; no masses or thyromegaly. Lungs:  Respirations even and unlabored.  Clear throughout to auscultation.   No wheezes, crackles, or rhonchi. No acute distress. Heart:  Regular rate and rhythm; no murmurs, clicks, rubs, or gallops. Abdomen:  Normal bowel sounds. Soft, mild to moderate epigastric tenderness and mildly distended, tympanic without masses, hepatosplenomegaly or hernias noted.  No guarding or rebound tenderness.   Rectal: Not performed Msk:  Symmetrical without gross deformities. Good, equal movement & strength bilaterally. Pulses:  Normal pulses noted. Extremities:  No clubbing or edema.  No cyanosis. Neurologic:  Alert and oriented x3;  grossly normal neurologically. Skin:  Intact without significant lesions or rashes. No jaundice. Psych:  Alert and cooperative. Normal mood and affect.  Imaging Studies: Reviewed  Assessment and Plan:   Adrienne Savage is a 48 y.o. African-American female with history of Roux-en-Y gastric bypass in 10/2016 at [facility] here for follow-up of acute on chronic epigastric pain.  She had possible small bowel intussusception which was transient but the repeat imaging was unremarkable.  Differentials include recurrence of the anastomotic ulcer or small intestinal bacterial overgrowth or secondary to chronic constipation.   Epigastric pain Continue omeprazole 40 mg twice daily Trial of 2 weeks course of Cipro and Flagyl for possible bacterial overgrowth Trial of Linzess 145 MCG daily, samples provided If pain persists, will perform CT enterography  as well as upper endoscopy Patient will contact me via my chart in 1 week  Colon cancer screening: Normal colonoscopy, recommend surveillance in 2029   Follow up in 1 month   Adrienne Darby, MD

## 2018-10-04 ENCOUNTER — Telehealth: Payer: Self-pay | Admitting: Gastroenterology

## 2018-10-04 NOTE — Telephone Encounter (Signed)
Patient LVM that she is still having stomach pain and wants to go ahead and schedule a proceure.

## 2018-10-05 ENCOUNTER — Other Ambulatory Visit: Payer: Self-pay

## 2018-10-05 ENCOUNTER — Telehealth: Payer: Self-pay | Admitting: Gastroenterology

## 2018-10-05 DIAGNOSIS — G8929 Other chronic pain: Secondary | ICD-10-CM

## 2018-10-05 DIAGNOSIS — R1013 Epigastric pain: Secondary | ICD-10-CM

## 2018-10-05 NOTE — Telephone Encounter (Signed)
Patient returned call to [facility].

## 2018-10-05 NOTE — Telephone Encounter (Signed)
Heather with the Pre service ctr called stating patient has new [employer] efft 09-29-18 & this requires a Presert for he ct  Scheduled for 10-13-18.

## 2018-10-05 NOTE — Telephone Encounter (Signed)
Pt has been scheduled for CT enterography on 10/13/18 @[facility], EGD has been scheduled for 10/21/2018 @ [place] [facility], pt has been notified and verbalized understanding

## 2018-10-07 NOTE — Telephone Encounter (Signed)
Heather called from the [facility] Service center see prior message.

## 2018-10-11 ENCOUNTER — Encounter: Payer: Self-pay | Admitting: Gastroenterology

## 2018-10-11 NOTE — Telephone Encounter (Signed)
Prior authorization request is still pending. Due to close today. Will recheck later today.

## 2018-10-12 ENCOUNTER — Telehealth: Payer: Self-pay | Admitting: Gastroenterology

## 2018-10-12 NOTE — Telephone Encounter (Signed)
Pt is calling to speak to Nurse regarding her CT scan on 10/13/18 she states she would like to cancel for now and watch her diet entry she states she currently is not in pain and would like to monitor it. She was given central scheduling number to cancel her ct scan. Pt would like a call to discuss this further

## 2018-10-13 ENCOUNTER — Ambulatory Visit: Payer: BC Managed Care – PPO

## 2018-10-13 NOTE — Telephone Encounter (Signed)
Spoke with pt and she has cancelled her CT scan due to insurance will not cover also cancelled EGD she states she will reschedule procedure later

## 2018-10-14 DIAGNOSIS — G4733 Obstructive sleep apnea (adult) (pediatric): Secondary | ICD-10-CM | POA: Diagnosis not present

## 2018-10-21 ENCOUNTER — Ambulatory Visit: Admit: 2018-10-21 | Payer: BC Managed Care – PPO | Admitting: Gastroenterology

## 2018-10-21 SURGERY — ESOPHAGOGASTRODUODENOSCOPY (EGD) WITH PROPOFOL
Anesthesia: General

## 2018-10-26 ENCOUNTER — Ambulatory Visit: Payer: BC Managed Care – PPO | Admitting: Gastroenterology

## 2018-11-08 DIAGNOSIS — F329 Major depressive disorder, single episode, unspecified: Secondary | ICD-10-CM | POA: Diagnosis not present

## 2018-11-08 DIAGNOSIS — F419 Anxiety disorder, unspecified: Secondary | ICD-10-CM | POA: Diagnosis not present

## 2018-11-12 ENCOUNTER — Other Ambulatory Visit: Payer: Self-pay

## 2018-11-12 ENCOUNTER — Ambulatory Visit (INDEPENDENT_AMBULATORY_CARE_PROVIDER_SITE_OTHER): Payer: BC Managed Care – PPO | Admitting: Internal Medicine

## 2018-11-12 ENCOUNTER — Encounter: Payer: Self-pay | Admitting: Internal Medicine

## 2018-11-12 VITALS — Ht 64.0 in | Wt 183.0 lb

## 2018-11-12 DIAGNOSIS — E559 Vitamin D deficiency, unspecified: Secondary | ICD-10-CM | POA: Diagnosis not present

## 2018-11-12 DIAGNOSIS — I10 Essential (primary) hypertension: Secondary | ICD-10-CM | POA: Diagnosis not present

## 2018-11-12 DIAGNOSIS — K5900 Constipation, unspecified: Secondary | ICD-10-CM

## 2018-11-12 DIAGNOSIS — E538 Deficiency of other specified B group vitamins: Secondary | ICD-10-CM

## 2018-11-12 DIAGNOSIS — Z1329 Encounter for screening for other suspected endocrine disorder: Secondary | ICD-10-CM

## 2018-11-12 DIAGNOSIS — R739 Hyperglycemia, unspecified: Secondary | ICD-10-CM

## 2018-11-12 DIAGNOSIS — Z9884 Bariatric surgery status: Secondary | ICD-10-CM

## 2018-11-12 DIAGNOSIS — E611 Iron deficiency: Secondary | ICD-10-CM

## 2018-11-12 MED ORDER — B-12-SL 1000 MCG SL SUBL: 1.0000 | SUBLINGUAL_TABLET | Freq: Every day | SUBLINGUAL | 3 refills | Status: DC

## 2018-11-12 MED ORDER — CHOLECALCIFEROL 100 MCG (4000 UT) PO CAPS: 1.0000 | ORAL_CAPSULE | Freq: Every day | ORAL | 3 refills | Status: DC

## 2018-11-12 NOTE — Patient Instructions (Signed)
Linaclotide oral capsules What is this medicine? LINACLOTIDE (lin a KLOE tide) is used to treat irritable bowel syndrome (IBS) with constipation as the main problem. It may also be used for relief of chronic constipation. This medicine may be used for other purposes; ask your health care provider or pharmacist if you have questions. COMMON BRAND NAME(S): Linzess What should I tell my health care provider before I take this medicine? They need to know if you have any of these conditions:  history of stool (fecal) impaction  now have diarrhea or have diarrhea often  other medical condition  stomach or intestinal disease, including bowel obstruction or abdominal adhesions  an unusual or allergic reaction to linaclotide, other medicines, foods, dyes, or preservatives  pregnant or trying to get pregnant  breast-feeding How should I use this medicine? Take this medicine by mouth with a glass of water. Follow the directions on the prescription label. Do not cut, crush or chew this medicine. Take on an empty stomach, at least 30 minutes before your first meal of the day. Take your medicine at regular intervals. Do not take your medicine more often than directed. Do not stop taking except on your doctor's advice. A special MedGuide will be given to you by the pharmacist with each prescription and refill. Be sure to read this information carefully each time. Talk to your pediatrician regarding the use of this medicine in children. This medicine is not approved for use in children. Overdosage: If you think you have taken too much of this medicine contact a poison control center or emergency room at once. NOTE: This medicine is only for you. Do not share this medicine with others. What if I miss a dose? If you miss a dose, just skip that dose. Wait until your next dose, and take only that dose. Do not take double or extra doses. What may interact with this medicine?  certain medicines for bowel  problems or bladder incontinence (these can cause constipation) This list may not describe all possible interactions. Give your health care provider a list of all the medicines, herbs, non-prescription drugs, or dietary supplements you use. Also tell them if you smoke, drink alcohol, or use illegal drugs. Some items may interact with your medicine. What should I watch for while using this medicine? Visit your doctor for regular check ups. Tell your doctor if your symptoms do not get better or if they get worse. Diarrhea is a common side effect of this medicine. It often begins within 2 weeks of starting this medicine. Stop taking this medicine and call your doctor if you get severe diarrhea. Stop taking this medicine and call your doctor or go to the nearest hospital emergency room right away if you develop unusual or severe stomach-area (abdominal) pain, especially if you also have bright red, bloody stools or black stools that look like tar. What side effects may I notice from receiving this medicine? Side effects that you should report to your doctor or health care professional as soon as possible:  allergic reactions like skin rash, itching or hives, swelling of the face, lips, or tongue  black, tarry stools  bloody or watery diarrhea  new or worsening stomach pain  severe or prolonged diarrhea Side effects that usually do not require medical attention (report to your doctor or health care professional if they continue or are bothersome):  bloating  gas  loose stools This list may not describe all possible side effects. Call your doctor for medical advice  about side effects. You may report side effects to FDA at 1-800-FDA-1088. Where should I keep my medicine? Keep out of the reach of children. Store at room temperature between 20 and 25 degrees C (68 and 77 degrees F). Keep this medicine in the original container. Keep tightly closed in a dry place. Do not remove the desiccant packet  from the bottle, it helps to protect your medicine from moisture. Throw away any unused medicine after the expiration date. NOTE: This sheet is a summary. It may not cover all possible information. If you have questions about this medicine, talk to your doctor, pharmacist, or health care provider.  2020 Elsevier/Gold Standard (2015-04-19 12:17:04)

## 2018-11-12 NOTE — Progress Notes (Signed)
Virtual Visit via Video Note  I connected with Adrienne Savage  on 11/12/18 at  8:15 AM EDT by a video enabled telemedicine application and verified that I am speaking with the correct person using two identifiers.  Location patient: home Location provider:work or home office Persons participating in the virtual visit: patient, provider, pts husband  I discussed the limitations of evaluation and management by telemedicine and the availability of in person appointments. The patient expressed understanding and agreed to proceed.   HPI: 1. HTN bp pt thinks improved though not checking on norvasc 5 and hctz 12.5 wt is down to 183 lbs she has not had h/a since 05/2018 like she used to and think its due to BP being controlled  2. ED visit [facility] 07/2018 for abdominal pain US GB distension and Xray negative and she had CT 08/03/18  IMPRESSION: 1. Status post gastric bypass surgery. Left mid abdominal short segment small bowel intussusception near the small bowel anastomosis containing small bowel and small amount of mesentery. No evidence for obstruction, inflammatory change, or adverse features at this time. 2. Otherwise no CT evidence for acute intra-abdominal or pelvic abnormality.  She saw [facility] and Dr. Marius Ditch and given linzess 145 x 14 days, cipro/flagyl, and ppi bid which her with her constipation issues  She is also doing Juice plus supplements to try to get in more fiber since she has had gastric bypass and limited on what she eats. She has also tried Senna S for constipation but does not want to get dependent on this.  3. H/a migraines none since 05/2018 pt is please with this  4. S/p gastric bypass she wants Rx of B12 and D3 due to cost    ROS: See pertinent positives and negatives per HPI.  Past Medical History:  Diagnosis Date  . Allergy   . Anxiety   . Asthma   . Colon cancer screening   . Depression   . Eczema   . Fatigue   . Frequent headaches   . Gastric ulcer   . GERD  (gastroesophageal reflux disease)   . Goiter   . History of chicken pox   . HPV in female   . Hypertension   . Insomnia   . Leg pain   . Snoring   . UTI (urinary tract infection)     Past Surgical History:  Procedure Laterality Date  . CESAREAN SECTION  2008  . COLONOSCOPY WITH PROPOFOL N/A 02/08/2018   Procedure: COLONOSCOPY WITH PROPOFOL;  Surgeon: Lin Landsman, MD;  Location: [facility] ENDOSCOPY;  Service: Gastroenterology;  Laterality: N/A;  . CYSTECTOMY     cyst on lip  . ESOPHAGOGASTRODUODENOSCOPY (EGD) WITH PROPOFOL N/A 02/08/2018   Procedure: ESOPHAGOGASTRODUODENOSCOPY (EGD) WITH PROPOFOL;  Surgeon: Lin Landsman, MD;  Location: [place];  Service: Gastroenterology;  Laterality: N/A;  . GASTRIC BYPASS     8/18  . MASS EXCISION  06/23/2011   Procedure: EXCISION MASS;  Surgeon: Wynonia Sours, MD;  Location: [place];  Service: Orthopedics;  Laterality: Left;  left thumb and left index finger  . MULTIPLE TOOTH EXTRACTIONS  2/12  . ROTATOR CUFF REPAIR     right 09/2017 tear repair     Family History  Problem Relation Age of Onset  . Heart disease Father   . Glaucoma Father   . Thyroid disease Mother   . Hypertension Mother   . Alcohol abuse Mother   . Depression Mother   . Prostate  cancer Maternal Grandfather   . Asthma Son   . Learning disabilities Son     SOCIAL HX: married    Current Outpatient Medications:  .  ALPRAZolam (XANAX) 0.5 MG tablet, Take 0.5 mg by mouth 2 (two) times daily as needed. , Disp: , Rfl:  .  amLODipine (NORVASC) 5 MG tablet, Take 1 tablet (5 mg total) by mouth daily., Disp: 90 tablet, Rfl: 3 .  CALCIUM-VITAMIN D PO, Take by mouth 2 (two) times daily., Disp: , Rfl:  .  FLUoxetine (PROZAC) 20 MG capsule, , Disp: , Rfl:  .  hydrochlorothiazide (HYDRODIURIL) 12.5 MG tablet, Take 1 tablet (12.5 mg total) by mouth daily. In am, Disp: 90 tablet, Rfl: 3 .  latanoprost (XALATAN) 0.005 % ophthalmic solution, INSTILL 1  DROP(S) IN EACH EYE ONCE DAILY AT BEDTIME, Disp: , Rfl: 6 .  Multiple Vitamin (MULTI-VITAMINS) TABS, Take by mouth., Disp: , Rfl:  .  SUMAtriptan (IMITREX) 50 MG tablet, May repeat in 2 hours if headache persists or recurs. Max 4 pills in 24 hours, Disp: 10 tablet, Rfl: 2 .  varenicline (CHANTIX) 1 MG tablet, Take 1 tablet (1 mg total) by mouth 2 (two) times daily., Disp: 180 tablet, Rfl: 0 .  Varenicline Tartrate (CHANTIX PO), Chantix Starting Month Box 0.5 mg (11)-1 mg (42) tablets in dose pack, Disp: , Rfl:  .  Cholecalciferol 100 MCG (4000 UT) CAPS, Take 1 capsule (4,000 Units total) by mouth daily. 4000 IU total daily, Disp: 90 capsule, Rfl: 3 .  [facility]S BISACODYL 10 MG suppository, PLACE 1 SUPPOSITORY (10 MG TOTAL) RECTALLY ONCE DAILY AS NEEDED FOR CONSTIPATION FOR UP TO 10 DAYS, Disp: , Rfl:  .  [facility]S GAS RELIEF 80 MG chewable tablet, TAKE 1 TABLET (80 MG TOTAL) BY MOUTH EVERY 6 (SIX) HOURS AS NEEDED FOR INDIGESTION FOR UP TO 10 DAYS, Disp: , Rfl:  .  CVS MILK OF MAGNESIA 400 MG/5ML suspension, TAKE 15 MLS BY MOUTH ONCE DAILY AS NEEDED FOR CONSTIPATION FOR UP TO 30 DAYS, Disp: , Rfl:  .  [facility]S SENNA 8.6 MG tablet, Take 2 tablets by mouth 2 (two) times daily., Disp: , Rfl:  .  Cyanocobalamin (B-12-SL) 1000 MCG SUBL, Place 1 tablet (1,000 mcg total) under the tongue daily., Disp: 90 tablet, Rfl: 3 .  doxepin (SINEQUAN) 25 MG capsule, TAKE 1 2 CAPSULES BY MOUTH EVERY DAY AT BEDTIME FOR INSOMNIA, Disp: , Rfl:  .  montelukast (SINGULAIR) 10 MG tablet, Take 1 tablet (10 mg total) by mouth at bedtime. (Patient not taking: Reported on 06/04/2018), Disp: 90 tablet, Rfl: 3 .  omeprazole (PRILOSEC) 40 MG capsule, Take 1 capsule (40 mg total) by mouth 2 (two) times daily before a meal., Disp: 180 capsule, Rfl: 0 .  polyethylene glycol (MIRALAX / GLYCOLAX) 17 g packet, TAKE 1 PACKET (17 G TOTAL) BY MOUTH ONCE DAILY FOR 30 DAYS MIX IN 4 8OUNCES OF FLUID PRIOR TO TAKING, Disp: , Rfl:  .  traMADol (ULTRAM) 50 MG tablet,  TAKE 1 TABLET (50 MG TOTAL) BY MOUTH EVERY 4 (FOUR) HOURS AS NEEDED FOR UP TO 5 DAYS, Disp: , Rfl:  .  traZODone (DESYREL) 100 MG tablet, Take 100 mg by mouth at bedtime. , Disp: , Rfl: 0 .  varenicline (CHANTIX) 1 MG tablet, Chantix Continuing Month Box 1 mg tablet, Disp: , Rfl:  .  Vilazodone HCl (VIIBRYD) 20 MG TABS, Take 20 mg by mouth daily., Disp: , Rfl:   Current Facility-Administered Medications:  .  betamethasone acetate-betamethasone sodium phosphate (CELESTONE) injection 3 mg, 3 mg, Intramuscular, Once, Evans, Dorathy Daft, DPM  EXAM:  VITALS per patient if applicable:  GENERAL: alert, oriented, appears well and in no acute distress  HEENT: atraumatic, conjunttiva clear, no obvious abnormalities on inspection of external nose and ears  NECK: normal movements of the head and neck  LUNGS: on inspection no signs of respiratory distress, breathing rate appears normal, no obvious gross SOB, gasping or wheezing  CV: no obvious cyanosis  MS: moves all visible extremities without noticeable abnormality  PSYCH/NEURO: pleasant and cooperative, no obvious depression or anxiety, speech and thought processing grossly intact  ASSESSMENT AND PLAN:  Discussed the following assessment and plan:  Constipation, unspecified constipation type - Plan: pt will call back if wants linzess 145 for now hold and wants to try increased fiber intake via juice plus supplement   B12 deficiency - Plan: Cyanocobalamin (B-12-SL) 1000 MCG SUBL qd   Vitamin D deficiency - Plan: Cholecalciferol 100 MCG (4000 UT) CAPS qd pt already taking low dose vitamin D 3 otc as well total daily dose rec 5000 IU qd   Essential hypertension - Plan:  Cont meds norvac 5 and hctz 12.5 mg qd   H/O gastric bypass - Plan: B12, vitamin D, and will need to f/u iron levels as well   HM  Flu shot utd decines flu 2020/2021  Tdap utd  Declines HIV check Declines MMR   Reviewed labs 11/16/17 will schedule fasting labs 03/01/19    Pap records Dr. Raphael Gibney [facility] [place] h/o HPV/abnormal pap -09/11/17 neg pap no HPV Mammogram OB/GYNhad 09/11/17 [facility] negative -encouraged pt 11/12/18 to call ob/gyn to get mammogram   Colonoscopy   02/08/18 Dr. Marius Ditch due again in 2029   Ortho Dr. Malena Catholic ortho  Dr. Derrill Kay Spangler Bariatric surgery   [facility] wears glasses FH glaucoma being monitored for elevated pressure in her eye not yet dx'ed glaucoma  Obtained records ob/GYN above [facility] cerv[facility]x 04/1999 Colp 03/06/98-09/20/97  H/o HSV  I discussed the assessment and treatment plan with the patient. The patient was provided an opportunity to ask questions and all were answered. The patient agreed with the plan and demonstrated an understanding of the instructions.   The patient was advised to call back or seek an in-person evaluation if the symptoms worsen or if the condition fails to improve as anticipated.  Time spent 20 minutes  Delorise Jackson, MD

## 2018-11-14 DIAGNOSIS — G4733 Obstructive sleep apnea (adult) (pediatric): Secondary | ICD-10-CM | POA: Diagnosis not present

## 2018-12-15 DIAGNOSIS — G4733 Obstructive sleep apnea (adult) (pediatric): Secondary | ICD-10-CM | POA: Diagnosis not present

## 2019-01-11 ENCOUNTER — Other Ambulatory Visit: Payer: Self-pay | Admitting: Internal Medicine

## 2019-01-11 DIAGNOSIS — G43011 Migraine without aura, intractable, with status migrainosus: Secondary | ICD-10-CM

## 2019-01-11 MED ORDER — SUMATRIPTAN SUCCINATE 50 MG PO TABS: ORAL_TABLET | ORAL | 5 refills | Status: DC

## 2019-01-14 DIAGNOSIS — G4733 Obstructive sleep apnea (adult) (pediatric): Secondary | ICD-10-CM | POA: Diagnosis not present

## 2019-01-25 ENCOUNTER — Other Ambulatory Visit: Payer: Self-pay

## 2019-01-25 ENCOUNTER — Ambulatory Visit (INDEPENDENT_AMBULATORY_CARE_PROVIDER_SITE_OTHER): Payer: BC Managed Care – PPO

## 2019-01-25 DIAGNOSIS — Z23 Encounter for immunization: Secondary | ICD-10-CM

## 2019-02-17 ENCOUNTER — Other Ambulatory Visit: Payer: Self-pay | Admitting: Internal Medicine

## 2019-02-17 DIAGNOSIS — J329 Chronic sinusitis, unspecified: Secondary | ICD-10-CM

## 2019-02-17 MED ORDER — MONTELUKAST SODIUM 10 MG PO TABS: 10.0000 mg | ORAL_TABLET | Freq: Every day | ORAL | 3 refills | Status: DC

## 2019-02-28 ENCOUNTER — Other Ambulatory Visit: Payer: Self-pay

## 2019-03-01 ENCOUNTER — Other Ambulatory Visit (INDEPENDENT_AMBULATORY_CARE_PROVIDER_SITE_OTHER): Payer: BC Managed Care – PPO

## 2019-03-01 DIAGNOSIS — E538 Deficiency of other specified B group vitamins: Secondary | ICD-10-CM

## 2019-03-01 DIAGNOSIS — E559 Vitamin D deficiency, unspecified: Secondary | ICD-10-CM

## 2019-03-01 DIAGNOSIS — E611 Iron deficiency: Secondary | ICD-10-CM | POA: Diagnosis not present

## 2019-03-01 DIAGNOSIS — R739 Hyperglycemia, unspecified: Secondary | ICD-10-CM | POA: Diagnosis not present

## 2019-03-01 DIAGNOSIS — Z1329 Encounter for screening for other suspected endocrine disorder: Secondary | ICD-10-CM | POA: Diagnosis not present

## 2019-03-01 DIAGNOSIS — I10 Essential (primary) hypertension: Secondary | ICD-10-CM

## 2019-03-01 LAB — COMPREHENSIVE METABOLIC PANEL
ALT: 7 U/L (ref 0–35)
AST: 13 U/L (ref 0–37)
Albumin: 3.9 g/dL (ref 3.5–5.2)
Alkaline Phosphatase: 98 U/L (ref 39–117)
BUN: 10 mg/dL (ref 6–23)
CO2: 31 mEq/L (ref 19–32)
Calcium: 9 mg/dL (ref 8.4–10.5)
Chloride: 105 mEq/L (ref 96–112)
Creatinine, Ser: 0.85 mg/dL (ref 0.40–1.20)
GFR: 85.77 mL/min (ref >60.00)
Glucose, Bld: 131 mg/dL — ABNORMAL HIGH (ref 70–99)
Potassium: 3.8 mEq/L (ref 3.5–5.1)
Sodium: 142 mEq/L (ref 135–145)
Total Bilirubin: 0.6 mg/dL (ref 0.2–1.2)
Total Protein: 6.7 g/dL (ref 6.0–8.3)

## 2019-03-01 LAB — HEMOGLOBIN A1C: Hgb A1c MFr Bld: 5 % (ref 4.6–6.5)

## 2019-03-01 LAB — IBC + FERRITIN
Ferritin: 11.4 ng/mL (ref 10.0–291.0)
Iron: 89 ug/dL (ref 42–145)
Saturation Ratios: 22.4 % (ref 20.0–50.0)
Transferrin: 284 mg/dL (ref 212.0–360.0)

## 2019-03-01 LAB — VITAMIN D 25 HYDROXY (VIT D DEFICIENCY, FRACTURES): VITD: 38.92 ng/mL (ref 30.00–100.00)

## 2019-03-01 LAB — CBC WITH DIFFERENTIAL/PLATELET
Basophils Absolute: 0 10*3/uL (ref 0.0–0.1)
Basophils Relative: 0.5 % (ref 0.0–3.0)
Eosinophils Absolute: 0.2 10*3/uL (ref 0.0–0.7)
Eosinophils Relative: 3.3 % (ref 0.0–5.0)
HCT: 37.4 % (ref 36.0–46.0)
Hemoglobin: 12.4 g/dL (ref 12.0–15.0)
Lymphocytes Relative: 29.4 % (ref 12.0–46.0)
Lymphs Abs: 1.6 10*3/uL (ref 0.7–4.0)
MCHC: 33.2 g/dL (ref 30.0–36.0)
MCV: 88.4 fl (ref 78.0–100.0)
Monocytes Absolute: 0.4 10*3/uL (ref 0.1–1.0)
Monocytes Relative: 7.8 % (ref 3.0–12.0)
Neutro Abs: 3.2 10*3/uL (ref 1.4–7.7)
Neutrophils Relative %: 59 % (ref 43.0–77.0)
Platelets: 256 10*3/uL (ref 150.0–400.0)
RBC: 4.23 Mil/uL (ref 3.87–5.11)
RDW: 12.8 % (ref 11.5–15.5)
WBC: 5.4 10*3/uL (ref 4.0–10.5)

## 2019-03-01 LAB — LIPID PANEL
Cholesterol: 169 mg/dL (ref 0–200)
HDL: 50.6 mg/dL (ref >39.00)
LDL Cholesterol: 104 mg/dL — ABNORMAL HIGH (ref 0–99)
NonHDL: 117.99
Total CHOL/HDL Ratio: 3
Triglycerides: 71 mg/dL (ref 0.0–149.0)
VLDL: 14.2 mg/dL (ref 0.0–40.0)

## 2019-03-01 LAB — VITAMIN B12: Vitamin B-12: 616 pg/mL (ref 211–911)

## 2019-03-01 LAB — TSH: TSH: 0.88 u[IU]/mL (ref 0.35–4.50)

## 2019-03-02 LAB — IRON,TIBC AND FERRITIN PANEL
%SAT: 25 % (calc) (ref 16–45)
Ferritin: 13 ng/mL — ABNORMAL LOW (ref 16–232)
Iron: 90 ug/dL (ref 40–190)
TIBC: 367 mcg/dL (calc) (ref 250–450)

## 2019-03-10 DIAGNOSIS — F329 Major depressive disorder, single episode, unspecified: Secondary | ICD-10-CM | POA: Diagnosis not present

## 2019-03-10 DIAGNOSIS — F419 Anxiety disorder, unspecified: Secondary | ICD-10-CM | POA: Diagnosis not present

## 2019-04-07 ENCOUNTER — Ambulatory Visit: Payer: BC Managed Care – PPO | Admitting: Internal Medicine

## 2019-04-07 ENCOUNTER — Ambulatory Visit (INDEPENDENT_AMBULATORY_CARE_PROVIDER_SITE_OTHER): Payer: BC Managed Care – PPO | Admitting: Internal Medicine

## 2019-04-07 VITALS — Ht 64.0 in | Wt 175.0 lb

## 2019-04-07 DIAGNOSIS — K257 Chronic gastric ulcer without hemorrhage or perforation: Secondary | ICD-10-CM | POA: Diagnosis not present

## 2019-04-07 DIAGNOSIS — I10 Essential (primary) hypertension: Secondary | ICD-10-CM | POA: Diagnosis not present

## 2019-04-07 DIAGNOSIS — Z0000 Encounter for general adult medical examination without abnormal findings: Secondary | ICD-10-CM

## 2019-04-07 MED ORDER — OMEPRAZOLE 40 MG PO CPDR: 40.0000 mg | DELAYED_RELEASE_CAPSULE | Freq: Every day | ORAL | 3 refills | Status: DC

## 2019-04-07 NOTE — Progress Notes (Signed)
Telephone Note  I connected with Adrienne Savage  on 04/07/19 at  8:11 AM EST by a video enabled telemedicine application and verified that I am speaking with the correct person using two identifiers.  Location patient: outside of home son getting IEP testing Location provider:work or home office Persons participating in the virtual visit: patient, provider  I discussed the limitations of evaluation and management by telemedicine and the availability of in person appointments. The patient expressed understanding and agreed to proceed.   HPI: annual 1.HTN not checked BP since 04/01/19 and cant remember reading dbp was 71 but unclear of sbp on norvasc 5 and hctz 12.5 mg qd 2. Denies constipation doing juice plus and helping linzess no longer needed she is regular  3. S/p wt loss surgery with GJ ulcer at anastamosis on prilosec 40 mg qd no refill needed at this time not taking supplements but encouraged to take iron,B12, vitamin D with multivitamin daily she stopped and review of last labs vitamin D ~38 low normal and ferritin low    ROS: See pertinent positives and negatives per HPI. General: wt loss trying thinking about being vegan  HEENT: normal hearing  CV: no chest pain  Lungs: no sob/cough GI: no ab pain/GIB GU: no issues  MSK: no pain  Skin: normal  Neuro: no h/a  Psych: mood normal feels the best she has ever    Past Medical History:  Diagnosis Date  . Allergy   . Anxiety   . Asthma   . Colon cancer screening   . Depression   . Eczema   . Fatigue   . Frequent headaches   . Gastric ulcer   . GERD (gastroesophageal reflux disease)   . Goiter   . History of chicken pox   . HPV in female   . Hypertension   . Insomnia   . Leg pain   . Snoring   . UTI (urinary tract infection)     Past Surgical History:  Procedure Laterality Date  . CESAREAN SECTION  2008  . COLONOSCOPY WITH PROPOFOL N/A 02/08/2018   Procedure: COLONOSCOPY WITH PROPOFOL;  Surgeon: Lin Landsman, MD;  Location[facility] ENDOSCOPY;  Service: Gastroenterology;  Laterality: N/A;  . CYSTECTOMY     cyst on lip  . ESOPHAGOGASTRODUODENOSCOPY (EGD) WITH PROPOFOL N/A 02/08/2018   Procedure: ESOPHAGOGASTRODUODENOSCOPY (EGD) WITH PROPOFOL;  Surgeon: Lin Landsman, MD;  Location: [place];  Service: Gastroenterology;  Laterality: N/A;  . GASTRIC BYPASS     8/18  . MASS EXCISION  06/23/2011   Procedure: EXCISION MASS;  Surgeon: Wynonia Sours, MD;  Location: [place];  Service: Orthopedics;  Laterality: Left;  left thumb and left index finger  . MULTIPLE TOOTH EXTRACTIONS  2/12  . ROTATOR CUFF REPAIR     right 09/2017 tear repair     Family History  Problem Relation Age of Onset  . Heart disease Father   . Glaucoma Father   . Thyroid disease Mother   . Hypertension Mother   . Alcohol abuse Mother   . Depression Mother   . Prostate cancer Maternal Grandfather   . Asthma Son   . Learning disabilities Son     SOCIAL HX: Drinks about 1 cup of coffee a day, occasionally drinks a soda.  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  ALPRAZolam (XANAX) 0.5 MG  tablet, Take 0.5 mg by mouth 2 (two) times daily as needed. , Disp: , Rfl:  .  amLODipine (NORVASC) 5 MG tablet, Take 1 tablet (5 mg total) by mouth daily., Disp: 90 tablet, Rfl: 3 .  CALCIUM-VITAMIN D PO, Take by mouth 2 (two) times daily., Disp: , Rfl:  .  Cholecalciferol 100 MCG (4000 UT) CAPS, Take 1 capsule (4,000 Units total) by mouth daily. 4000 IU total daily, Disp: 90 capsule, Rfl: 3 .  CVS BISACODYL 10 MG suppository, PLACE 1 SUPPOSITORY (10 MG TOTAL) RECTALLY ONCE DAILY AS NEEDED FOR CONSTIPATION FOR UP TO 10 DAYS, Disp: , Rfl:  .  CVS GAS RELIEF 80 MG chewable tablet, TAKE 1 TABLET (80 MG TOTAL) BY MOUTH EVERY 6 (SIX) HOURS AS NEEDED FOR INDIGESTION FOR UP TO 10 DAYS, Disp: , Rfl:  .  CVS MILK OF  MAGNESIA 400 MG/5ML suspension, TAKE 15 MLS BY MOUTH ONCE DAILY AS NEEDED FOR CONSTIPATION FOR UP TO 30 DAYS, Disp: , Rfl:  .  CVS SENNA 8.6 MG tablet, Take 2 tablets by mouth 2 (two) times daily., Disp: , Rfl:  .  Cyanocobalamin (B-12-SL) 1000 MCG SUBL, Place 1 tablet (1,000 mcg total) under the tongue daily., Disp: 90 tablet, Rfl: 3 .  doxepin (SINEQUAN) 25 MG capsule, TAKE 1 2 CAPSULES BY MOUTH EVERY DAY AT BEDTIME FOR INSOMNIA, Disp: , Rfl:  .  FLUoxetine (PROZAC) 20 MG capsule, , Disp: , Rfl:  .  hydrochlorothiazide (HYDRODIURIL) 12.5 MG tablet, Take 1 tablet (12.5 mg total) by mouth daily. In am, Disp: 90 tablet, Rfl: 3 .  latanoprost (XALATAN) 0.005 % ophthalmic solution, INSTILL 1 DROP(S) IN EACH EYE ONCE DAILY AT BEDTIME, Disp: , Rfl: 6 .  montelukast (SINGULAIR) 10 MG tablet, Take 1 tablet (10 mg total) by mouth at bedtime., Disp: 90 tablet, Rfl: 3 .  Multiple Vitamin (MULTI-VITAMINS) TABS, Take by mouth., Disp: , Rfl:  .  omeprazole (PRILOSEC) 40 MG capsule, Take 1 capsule (40 mg total) by mouth daily. 30 minutes before food, Disp: 90 capsule, Rfl: 3 .  polyethylene glycol (MIRALAX / GLYCOLAX) 17 g packet, TAKE 1 PACKET (17 G TOTAL) BY MOUTH ONCE DAILY FOR 30 DAYS MIX IN 4 8OUNCES OF FLUID PRIOR TO TAKING, Disp: , Rfl:  .  SUMAtriptan (IMITREX) 50 MG tablet, May repeat in 2 hours if headache persists or recurs. Max 4 pills in 24 hours, Disp: 10 tablet, Rfl: 5 .  traMADol (ULTRAM) 50 MG tablet, TAKE 1 TABLET (50 MG TOTAL) BY MOUTH EVERY 4 (FOUR) HOURS AS NEEDED FOR UP TO 5 DAYS, Disp: , Rfl:  .  traZODone (DESYREL) 100 MG tablet, Take 100 mg by mouth at bedtime. , Disp: , Rfl: 0 .  varenicline (CHANTIX) 1 MG tablet, Take 1 tablet (1 mg total) by mouth 2 (two) times daily., Disp: 180 tablet, Rfl: 0 .  varenicline (CHANTIX) 1 MG tablet, Chantix Continuing Month Box 1 mg tablet, Disp: , Rfl:  .  Varenicline Tartrate (CHANTIX PO), Chantix Starting Month Box 0.5 mg (11)-1 mg (42) tablets in dose  pack, Disp: , Rfl:  .  Vilazodone HCl (VIIBRYD) 20 MG TABS, Take 20 mg by mouth daily., Disp: , Rfl:   Current Facility-Administered Medications:  .  betamethasone acetate-betamethasone sodium phosphate (CELESTONE) injection 3 mg, 3 mg, Intramuscular, Once, Evans, Dorathy Daft, DPM  EXAM:  VITALS per patient if applicable:  GENERAL: alert, oriented, appears well and in no acute distress  HEENT: atraumatic, conjunttiva clear, no  obvious abnormalities on inspection of external nose and ears  NECK: normal movements of the head and neck  LUNGS: on inspection no signs of respiratory distress, breathing rate appears normal, no obvious gross SOB, gasping or wheezing  CV: no obvious cyanosis  MS: moves all visible extremities without noticeable abnormality  PSYCH/NEURO: pleasant and cooperative, no obvious depression or anxiety, speech and thought processing grossly intact  ASSESSMENT AND PLAN:  Discussed the following assessment and plan:  Annual physical exam Flu shotutd 2020  Tdap utd  Declines covid 19  Declines HIV check Declines MMRnot immune   Pap [facility] Dr. Trinna [facility] h/o HPV/abnormal pap -09/11/17 neg pap no HPV  Mammogram OB/GYNhad 09/11/17 [facility] negative -encouraged pt 04/07/19 to call ob/gyn to get mammogram   Colonoscopy   02/08/18 Dr. Marius Ditch due again in 2029   Ortho Dr. Malena Catholic ortho  Dr. 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 Pt prefers to treat via telemedicine empirically rather then risking or undertaking an in person visit at this moment. Patient agrees to seek prompt in person care if worsening, new symptoms arise, or if is not improving with treatment.   I discussed the assessment and treatment plan with the patient. The patient was provided an opportunity to ask questions and all were answered. The patient agreed with the plan and demonstrated an understanding of the instructions.   The patient was advised to call back or seek an in-person evaluation if the symptoms worsen or if the condition fails to improve as anticipated.  Time spent 20 minutes  Delorise Jackson, MD

## 2019-04-07 NOTE — Patient Instructions (Signed)
High Cholesterol  High cholesterol is a condition in which the blood has high levels of a white, waxy, fat-like substance (cholesterol). The human body needs small amounts of cholesterol. The liver makes all the cholesterol that the body needs. Extra (excess) cholesterol comes from the food that we eat. Cholesterol is carried from the liver by the blood through the blood vessels. If you have high cholesterol, deposits (plaques) may build up on the walls of your blood vessels (arteries). Plaques make the arteries narrower and stiffer. Cholesterol plaques increase your risk for heart attack and stroke. Work with your health care provider to keep your cholesterol levels in a healthy range. What increases the risk? This condition is more likely to develop in people who:  Eat foods that are high in animal fat (saturated fat) or cholesterol.  Are overweight.  Are not getting enough exercise.  Have a family history of high cholesterol. What are the signs or symptoms? There are no symptoms of this condition. How is this diagnosed? This condition may be diagnosed from the results of a blood test.  If you are older than age 20, your health care provider may check your cholesterol every 4-6 years.  You may be checked more often if you already have high cholesterol or other risk factors for heart disease. The blood test for cholesterol measures:  "Bad" cholesterol (LDL cholesterol). This is the main type of cholesterol that causes heart disease. The desired level for LDL is less than 100.  "Good" cholesterol (HDL cholesterol). This type helps to protect against heart disease by cleaning the arteries and carrying the LDL away. The desired level for HDL is 60 or higher.  Triglycerides. These are fats that the body can store or burn for energy. The desired number for triglycerides is lower than 150.  Total cholesterol. This is a measure of the total amount of cholesterol in your blood, including LDL  cholesterol, HDL cholesterol, and triglycerides. A healthy number is less than 200. How is this treated? This condition is treated with diet changes, lifestyle changes, and medicines. Diet changes  This may include eating more whole grains, fruits, vegetables, nuts, and fish.  This may also include cutting back on red meat and foods that have a lot of added sugar. Lifestyle changes  Changes may include getting at least 40 minutes of aerobic exercise 3 times a week. Aerobic exercises include walking, biking, and swimming. Aerobic exercise along with a healthy diet can help you maintain a healthy weight.  Changes may also include quitting smoking. Medicines  Medicines are usually given if diet and lifestyle changes have failed to reduce your cholesterol to healthy levels.  Your health care provider may prescribe a statin medicine. Statin medicines have been shown to reduce cholesterol, which can reduce the risk of heart disease. Follow these instructions at home: Eating and drinking If told by your health care provider:  Eat chicken (without skin), fish, veal, shellfish, ground turkey breast, and round or loin cuts of red meat.  Do not eat fried foods or fatty meats, such as hot dogs and salami.  Eat plenty of fruits, such as apples.  Eat plenty of vegetables, such as broccoli, potatoes, and carrots.  Eat beans, peas, and lentils.  Eat grains such as barley, rice, couscous, and bulgur wheat.  Eat pasta without cream sauces.  Use skim or nonfat milk, and eat low-fat or nonfat yogurt and cheeses.  Do not eat or drink whole milk, cream, ice cream, egg yolks,   or hard cheeses.  Do not eat stick margarine or tub margarines that contain trans fats (also called partially hydrogenated oils).  Do not eat saturated tropical oils, such as coconut oil and palm oil.  Do not eat cakes, cookies, crackers, or other baked goods that contain trans fats.  General instructions  Exercise as  directed by your health care provider. Increase your activity level with activities such as gardening, walking, and taking the stairs.  Take over-the-counter and prescription medicines only as told by your health care provider.  Do not use any products that contain nicotine or tobacco, such as cigarettes and e-cigarettes. If you need help quitting, ask your health care provider.  Keep all follow-up visits as told by your health care provider. This is important. Contact a health care provider if:  You are struggling to maintain a healthy diet or weight.  You need help to start on an exercise program.  You need help to stop smoking. Get help right away if:  You have chest pain.  You have trouble breathing. This information is not intended to replace advice given to you by your health care provider. Make sure you discuss any questions you have with your health care provider. Document Revised: 03/20/2017 Document Reviewed: 09/15/2015 Elsevier Patient Education  [place].  Cholesterol Content in Foods Cholesterol is a waxy, fat-like substance that helps to carry fat in the blood. The body needs cholesterol in small amounts, but too much cholesterol can cause damage to the arteries and heart. Most people should eat less than 200 milligrams (mg) of cholesterol a day. Foods with cholesterol  Cholesterol is found in animal-based foods, such as meat, seafood, and dairy. Generally, low-fat dairy and lean meats have less cholesterol than full-fat dairy and fatty meats. The milligrams of cholesterol per serving (mg per serving) of common cholesterol-containing foods are listed below. Meat and other proteins  Egg -- one large whole egg has 186 mg.  Veal shank -- 4 oz has 141 mg.  Lean ground [place] (93% lean) -- 4 oz has 118 mg.  Fat-trimmed lamb loin -- 4 oz has 106 mg.  Lean ground beef (90% lean) -- 4 oz has 100 mg.  Lobster -- 3.5 oz has 90 mg.  Pork loin chops -- 4 oz has  86 mg.  Canned salmon -- 3.5 oz has 83 mg.  Fat-trimmed beef top loin -- 4 oz has 78 mg.  Frankfurter -- 1 frank (3.5 oz) has 77 mg.  Crab -- 3.5 oz has 71 mg.  Roasted chicken without skin, white meat -- 4 oz has 66 mg.  Light bologna -- 2 oz has 45 mg.  Deli-cut [place] -- 2 oz has 31 mg.  Canned tuna -- 3.5 oz has 31 mg.  [place] Salines -- 1 oz has 29 mg.  Oysters and mussels (raw) -- 3.5 oz has 25 mg.  Mackerel -- 1 oz has 22 mg.  Trout -- 1 oz has 20 mg.  Pork sausage -- 1 link (1 oz) has 17 mg.  Salmon -- 1 oz has 16 mg.  Tilapia -- 1 oz has 14 mg. Dairy  Soft-serve ice cream --  cup (4 oz) has 103 mg.  Whole-milk yogurt -- 1 cup (8 oz) has 29 mg.  Cheddar cheese -- 1 oz has 28 mg.  American cheese -- 1 oz has 28 mg.  Whole milk -- 1 cup (8 oz) has 23 mg.  2% milk -- 1 cup (8 oz) has 18  mg.  Cream cheese -- 1 tablespoon (Tbsp) has 15 mg.  Cottage cheese --  cup (4 oz) has 14 mg.  Low-fat (1%) milk -- 1 cup (8 oz) has 10 mg.  Sour cream -- 1 Tbsp has 8.5 mg.  Low-fat yogurt -- 1 cup (8 oz) has 8 mg.  Nonfat Greek yogurt -- 1 cup (8 oz) has 7 mg.  Half-and-half cream -- 1 Tbsp has 5 mg. Fats and oils  Cod liver oil -- 1 tablespoon (Tbsp) has 82 mg.  Butter -- 1 Tbsp has 15 mg.  Lard -- 1 Tbsp has 14 mg.  Bacon grease -- 1 Tbsp has 14 mg.  Mayonnaise -- 1 Tbsp has 5-10 mg.  Margarine -- 1 Tbsp has 3-10 mg. Exact amounts of cholesterol in these foods may vary depending on specific ingredients and brands. Foods without cholesterol Most plant-based foods do not have cholesterol unless you combine them with a food that has cholesterol. Foods without cholesterol include:  Grains and cereals.  Vegetables.  Fruits.  Vegetable oils, such as olive, canola, and sunflower oil.  Legumes, such as peas, beans, and lentils.  Nuts and seeds.  Egg whites. Summary  The body needs cholesterol in small amounts, but too much cholesterol can cause damage  to the arteries and heart.  Most people should eat less than 200 milligrams (mg) of cholesterol a day. This information is not intended to replace advice given to you by your health care provider. Make sure you discuss any questions you have with your health care provider. Document Revised: 02/27/2017 Document Reviewed: 11/11/2016 Elsevier Patient Education  [place].

## 2019-04-14 ENCOUNTER — Ambulatory Visit: Payer: BC Managed Care – PPO | Admitting: Internal Medicine

## 2019-05-01 ENCOUNTER — Other Ambulatory Visit: Payer: Self-pay | Admitting: Gastroenterology

## 2019-05-01 DIAGNOSIS — K257 Chronic gastric ulcer without hemorrhage or perforation: Secondary | ICD-10-CM

## 2019-06-14 DIAGNOSIS — F329 Major depressive disorder, single episode, unspecified: Secondary | ICD-10-CM | POA: Diagnosis not present

## 2019-06-14 DIAGNOSIS — F419 Anxiety disorder, unspecified: Secondary | ICD-10-CM | POA: Diagnosis not present

## 2019-06-21 DIAGNOSIS — H401131 Primary open-angle glaucoma, bilateral, mild stage: Secondary | ICD-10-CM | POA: Diagnosis not present

## 2019-06-22 DIAGNOSIS — F419 Anxiety disorder, unspecified: Secondary | ICD-10-CM | POA: Diagnosis not present

## 2019-06-26 ENCOUNTER — Ambulatory Visit: Payer: BC Managed Care – PPO

## 2019-07-14 ENCOUNTER — Ambulatory Visit: Payer: BC Managed Care – PPO | Attending: Internal Medicine

## 2019-07-14 DIAGNOSIS — Z23 Encounter for immunization: Secondary | ICD-10-CM

## 2019-07-14 NOTE — Progress Notes (Signed)
   Covid-19 Vaccination Clinic  Name:  Adrienne Savage    MRN: [id number] DOB: [birth year]  07/14/2019  [name] was observed post Covid-19 immunization for 15 minutes without incident. She was provided with Vaccine Information Sheet and instruction to access the V-Safe system.   [name] was instructed to call 911 with any severe reactions post vaccine: Marland Kitchen Difficulty breathing  . Swelling of face and throat  . A fast heartbeat  . A bad rash all over body  . Dizziness and weakness   Immunizations Administered    Name Date Dose VIS Date Route   Pfizer COVID-19 Vaccine 07/14/2019  8:35 AM 0.3 mL 03/11/2019 Intramuscular   Manufacturer: [place]   Lot: [id number]   [place]: [id number]

## 2019-07-26 ENCOUNTER — Other Ambulatory Visit: Payer: Self-pay | Admitting: Internal Medicine

## 2019-07-26 DIAGNOSIS — I10 Essential (primary) hypertension: Secondary | ICD-10-CM

## 2019-07-26 MED ORDER — AMLODIPINE BESYLATE 5 MG PO TABS: 5.0000 mg | ORAL_TABLET | Freq: Every day | ORAL | 3 refills | Status: DC

## 2019-08-06 ENCOUNTER — Ambulatory Visit: Payer: BC Managed Care – PPO | Attending: Internal Medicine

## 2019-08-06 DIAGNOSIS — Z23 Encounter for immunization: Secondary | ICD-10-CM

## 2019-08-06 NOTE — Progress Notes (Signed)
   Covid-19 Vaccination Clinic  Name:  Adrienne Savage    MRN: [id number] DOB: 1970/04/24  08/06/2019  [name] was observed post Covid-19 immunization for 15 minutes without incident. She was provided with Vaccine Information Sheet and instruction to access the V-Safe system.   [name] was instructed to call 911 with any severe reactions post vaccine: Marland Kitchen Difficulty breathing  . Swelling of face and throat  . A fast heartbeat  . A bad rash all over body  . Dizziness and weakness   Immunizations Administered    Name Date Dose VIS Date Route   Pfizer COVID-19 Vaccine 08/06/2019  9:14 AM 0.3 mL 05/25/2018 Intramuscular   Manufacturer: [place]   Lot: [id number]   [place]: [id number]

## 2019-08-09 DIAGNOSIS — H401132 Primary open-angle glaucoma, bilateral, moderate stage: Secondary | ICD-10-CM | POA: Diagnosis not present

## 2019-09-06 DIAGNOSIS — H401131 Primary open-angle glaucoma, bilateral, mild stage: Secondary | ICD-10-CM | POA: Diagnosis not present

## 2019-09-06 DIAGNOSIS — H401132 Primary open-angle glaucoma, bilateral, moderate stage: Secondary | ICD-10-CM | POA: Diagnosis not present

## 2019-10-06 ENCOUNTER — Ambulatory Visit (INDEPENDENT_AMBULATORY_CARE_PROVIDER_SITE_OTHER): Payer: BC Managed Care – PPO | Admitting: Internal Medicine

## 2019-10-06 ENCOUNTER — Other Ambulatory Visit: Payer: Self-pay

## 2019-10-06 ENCOUNTER — Encounter: Payer: Self-pay | Admitting: Internal Medicine

## 2019-10-06 VITALS — BP 142/88 | HR 76 | Temp 97.7°F | Ht 64.0 in | Wt 189.8 lb

## 2019-10-06 DIAGNOSIS — Z720 Tobacco use: Secondary | ICD-10-CM

## 2019-10-06 DIAGNOSIS — I10 Essential (primary) hypertension: Secondary | ICD-10-CM | POA: Diagnosis not present

## 2019-10-06 DIAGNOSIS — Z9884 Bariatric surgery status: Secondary | ICD-10-CM

## 2019-10-06 DIAGNOSIS — M67911 Unspecified disorder of synovium and tendon, right shoulder: Secondary | ICD-10-CM

## 2019-10-06 DIAGNOSIS — G43011 Migraine without aura, intractable, with status migrainosus: Secondary | ICD-10-CM

## 2019-10-06 DIAGNOSIS — M1711 Unilateral primary osteoarthritis, right knee: Secondary | ICD-10-CM | POA: Diagnosis not present

## 2019-10-06 DIAGNOSIS — E611 Iron deficiency: Secondary | ICD-10-CM

## 2019-10-06 DIAGNOSIS — H409 Unspecified glaucoma: Secondary | ICD-10-CM | POA: Insufficient documentation

## 2019-10-06 DIAGNOSIS — F419 Anxiety disorder, unspecified: Secondary | ICD-10-CM

## 2019-10-06 LAB — CBC WITH DIFFERENTIAL/PLATELET
Basophils Absolute: 0 10*3/uL (ref 0.0–0.1)
Basophils Relative: 0.6 % (ref 0.0–3.0)
Eosinophils Absolute: 0.1 10*3/uL (ref 0.0–0.7)
Eosinophils Relative: 1.5 % (ref 0.0–5.0)
HCT: 38.7 % (ref 36.0–46.0)
Hemoglobin: 12.8 g/dL (ref 12.0–15.0)
Lymphocytes Relative: 26.4 % (ref 12.0–46.0)
Lymphs Abs: 1.6 10*3/uL (ref 0.7–4.0)
MCHC: 33.1 g/dL (ref 30.0–36.0)
MCV: 89.4 fl (ref 78.0–100.0)
Monocytes Absolute: 0.6 10*3/uL (ref 0.1–1.0)
Monocytes Relative: 9.5 % (ref 3.0–12.0)
Neutro Abs: 3.8 10*3/uL (ref 1.4–7.7)
Neutrophils Relative %: 62 % (ref 43.0–77.0)
Platelets: 218 10*3/uL (ref 150.0–400.0)
RBC: 4.33 Mil/uL (ref 3.87–5.11)
RDW: 13.4 % (ref 11.5–15.5)
WBC: 6.2 10*3/uL (ref 4.0–10.5)

## 2019-10-06 LAB — COMPREHENSIVE METABOLIC PANEL
ALT: 9 U/L (ref 0–35)
AST: 14 U/L (ref 0–37)
Albumin: 4.2 g/dL (ref 3.5–5.2)
Alkaline Phosphatase: 82 U/L (ref 39–117)
BUN: 14 mg/dL (ref 6–23)
CO2: 31 mEq/L (ref 19–32)
Calcium: 9.4 mg/dL (ref 8.4–10.5)
Chloride: 104 mEq/L (ref 96–112)
Creatinine, Ser: 0.96 mg/dL (ref 0.40–1.20)
GFR: 74.35 mL/min (ref >60.00)
Glucose, Bld: 61 mg/dL — ABNORMAL LOW (ref 70–99)
Potassium: 4.3 mEq/L (ref 3.5–5.1)
Sodium: 140 mEq/L (ref 135–145)
Total Bilirubin: 0.7 mg/dL (ref 0.2–1.2)
Total Protein: 6.9 g/dL (ref 6.0–8.3)

## 2019-10-06 LAB — LIPID PANEL
Cholesterol: 168 mg/dL (ref 0–200)
HDL: 51.1 mg/dL (ref >39.00)
LDL Cholesterol: 96 mg/dL (ref 0–99)
NonHDL: 116.96
Total CHOL/HDL Ratio: 3
Triglycerides: 105 mg/dL (ref 0.0–149.0)
VLDL: 21 mg/dL (ref 0.0–40.0)

## 2019-10-06 LAB — IRON,TIBC AND FERRITIN PANEL
%SAT: 32 % (calc) (ref 16–45)
Ferritin: 24 ng/mL (ref 16–232)
Iron: 129 ug/dL (ref 45–160)
TIBC: 405 mcg/dL (calc) (ref 250–450)

## 2019-10-06 MED ORDER — SUMATRIPTAN SUCCINATE 50 MG PO TABS: ORAL_TABLET | ORAL | 5 refills | Status: DC

## 2019-10-06 MED ORDER — HYDROCHLOROTHIAZIDE 12.5 MG PO TABS: 12.5000 mg | ORAL_TABLET | Freq: Every day | ORAL | 3 refills | Status: DC

## 2019-10-06 MED ORDER — VARENICLINE TARTRATE 0.5 MG PO TABS: ORAL_TABLET | ORAL | 0 refills | Status: DC

## 2019-10-06 MED ORDER — VARENICLINE TARTRATE 1 MG PO TABS: 1.0000 mg | ORAL_TABLET | Freq: Two times a day (BID) | ORAL | 1 refills | Status: DC

## 2019-10-06 NOTE — Patient Instructions (Addendum)
Goal <130/<80  polyiron 150 mg daily  integra (iron with vitamin C 500 mg qd    Voltaren gel 4x per day right knee pain    Coping with Quitting Smoking  Quitting smoking is a physical and mental challenge. You will face cravings, withdrawal symptoms, and temptation. Before quitting, work with your health care provider to make a plan that can help you cope. Preparation can help you quit and keep you from giving in. How can I cope with cravings? Cravings usually last for 5-10 minutes. If you get through it, the craving will pass. Consider taking the following actions to help you cope with cravings:  Keep your mouth busy: ? Chew sugar-free gum. ? Suck on hard candies or a straw. ? Brush your teeth.  Keep your hands and body busy: ? Immediately change to a different activity when you feel a craving. ? Squeeze or play with a ball. ? Do an activity or a hobby, like making bead jewelry, practicing needlepoint, or working with wood. ? Mix up your normal routine. ? Take a short exercise break. Go for a quick walk or run up and down stairs. ? Spend time in public places where smoking is not allowed.  Focus on doing something kind or helpful for someone else.  Call a friend or family member to talk during a craving.  Join a support group.  Call a quit line, such as 1-800-QUIT-NOW.  Talk with your health care provider about medicines that might help you cope with cravings and make quitting easier for you. How can I deal with withdrawal symptoms? Your body may experience negative effects as it tries to get used to not having nicotine in the system. These effects are called withdrawal symptoms. They may include:  Feeling hungrier than normal.  Trouble concentrating.  Irritability.  Trouble sleeping.  Feeling depressed.  Restlessness and agitation.  Craving a cigarette. To manage withdrawal symptoms:  Avoid places, people, and activities that trigger your cravings.  Remember  why you want to quit.  Get plenty of sleep.  Avoid coffee and other caffeinated drinks. These may worsen some of your symptoms. How can I handle social situations? Social situations can be difficult when you are quitting smoking, especially in the first few weeks. To manage this, you can:  Avoid parties, bars, and other social situations where people might be smoking.  Avoid alcohol.  Leave right away if you have the urge to smoke.  Explain to your family and friends that you are quitting smoking. Ask for understanding and support.  Plan activities with friends or family where smoking is not an option. What are some ways I can cope with stress? Wanting to smoke may cause stress, and stress can make you want to smoke. Find ways to manage your stress. Relaxation techniques can help. For example:  Breathe slowly and deeply, in through your nose and out through your mouth.  Listen to soothing, relaxing music.  Talk with a family member or friend about your stress.  Light a candle.  Soak in a bath or take a shower.  Think about a peaceful place. What are some ways I can prevent weight gain? Be aware that many people gain weight after they quit smoking. However, not everyone does. To keep from gaining weight, have a plan in place before you quit and stick to the plan after you quit. Your plan should include:  Having healthy snacks. When you have a craving, it may help to: ? Eat  plain popcorn, crunchy carrots, celery, or other cut vegetables. ? Chew sugar-free gum.  Changing how you eat: ? Eat small portion sizes at meals. ? Eat 4-6 small meals throughout the day instead of 1-2 large meals a day. ? Be mindful when you eat. Do not watch television or do other things that might distract you as you eat.  Exercising regularly: ? Make time to exercise each day. If you do not have time for a long workout, do short bouts of exercise for 5-10 minutes several times a day. ? Do some form  of strengthening exercise, like weight lifting, and some form of aerobic exercise, like running or swimming.  Drinking plenty of water or other low-calorie or no-calorie drinks. Drink 6-8 glasses of water daily, or as much as instructed by your health care provider. Summary  Quitting smoking is a physical and mental challenge. You will face cravings, withdrawal symptoms, and temptation to smoke again. Preparation can help you as you go through these challenges.  You can cope with cravings by keeping your mouth busy (such as by chewing gum), keeping your body and hands busy, and making calls to family, friends, or a helpline for people who want to quit smoking.  You can cope with withdrawal symptoms by avoiding places where people smoke, avoiding drinks with caffeine, and getting plenty of rest.  Ask your health care provider about the different ways to prevent weight gain, avoid stress, and handle social situations. This information is not intended to replace advice given to you by your health care provider. Make sure you discuss any questions you have with your health care provider. Document Revised: 02/27/2017 Document Reviewed: 03/14/2016 Elsevier Patient Education  [place].   Knee Exercises Ask your health care provider which exercises are safe for you. Do exercises exactly as told by your health care provider and adjust them as directed. It is normal to feel mild stretching, pulling, tightness, or discomfort as you do these exercises. Stop right away if you feel sudden pain or your pain gets worse. Do not begin these exercises until told by your health care provider. Stretching and range-of-motion exercises These exercises warm up your muscles and joints and improve the movement and flexibility of your knee. These exercises also help to relieve pain and swelling. Knee extension, prone 1. Lie on your abdomen (prone position) on a bed. 2. Place your left / right knee just beyond  the edge of the surface so your knee is not on the bed. You can put a towel under your left / right thigh just above your kneecap for comfort. 3. Relax your leg muscles and allow gravity to straighten your knee (extension). You should feel a stretch behind your left / right knee. 4. Hold this position for __________ seconds. 5. Scoot up so your knee is supported between repetitions. Repeat __________ times. Complete this exercise __________ times a day. Knee flexion, active  1. Lie on your back with both legs straight. If this causes back discomfort, bend your left / right knee so your foot is flat on the floor. 2. Slowly slide your left / right heel back toward your buttocks. Stop when you feel a gentle stretch in the front of your knee or thigh (flexion). 3. Hold this position for __________ seconds. 4. Slowly slide your left / right heel back to the starting position. Repeat __________ times. Complete this exercise __________ times a day. Quadriceps stretch, prone  1. Lie on your abdomen on a  firm surface, such as a bed or padded floor. 2. Bend your left / right knee and hold your ankle. If you cannot reach your ankle or pant leg, loop a belt around your foot and grab the belt instead. 3. Gently pull your heel toward your buttocks. Your knee should not slide out to the side. You should feel a stretch in the front of your thigh and knee (quadriceps). 4. Hold this position for __________ seconds. Repeat __________ times. Complete this exercise __________ times a day. Hamstring, supine 1. Lie on your back (supine position). 2. Loop a belt or towel over the ball of your left / right foot. The ball of your foot is on the walking surface, right under your toes. 3. Straighten your left / right knee and slowly pull on the belt to raise your leg until you feel a gentle stretch behind your knee (hamstring). ? Do not let your knee bend while you do this. ? Keep your other leg flat on the  floor. 4. Hold this position for __________ seconds. Repeat __________ times. Complete this exercise __________ times a day. Strengthening exercises These exercises build strength and endurance in your knee. Endurance is the ability to use your muscles for a long time, even after they get tired. Quadriceps, isometric This exercise stretches the muscles in front of your thigh (quadriceps) without moving your knee joint (isometric). 1. Lie on your back with your left / right leg extended and your other knee bent. Put a rolled towel or small pillow under your knee if told by your health care provider. 2. Slowly tense the muscles in the front of your left / right thigh. You should see your kneecap slide up toward your hip or see increased dimpling just above the knee. This motion will push the back of the knee toward the floor. 3. For __________ seconds, hold the muscle as tight as you can without increasing your pain. 4. Relax the muscles slowly and completely. Repeat __________ times. Complete this exercise __________ times a day. Straight leg raises This exercise stretches the muscles in front of your thigh (quadriceps) and the muscles that move your hips (hip flexors). 1. Lie on your back with your left / right leg extended and your other knee bent. 2. Tense the muscles in the front of your left / right thigh. You should see your kneecap slide up or see increased dimpling just above the knee. Your thigh may even shake a bit. 3. Keep these muscles tight as you raise your leg 4-6 inches (10-15 cm) off the floor. Do not let your knee bend. 4. Hold this position for __________ seconds. 5. Keep these muscles tense as you lower your leg. 6. Relax your muscles slowly and completely after each repetition. Repeat __________ times. Complete this exercise __________ times a day. Hamstring, isometric 1. Lie on your back on a firm surface. 2. Bend your left / right knee about __________ degrees. 3. Dig  your left / right heel into the surface as if you are trying to pull it toward your buttocks. Tighten the muscles in the back of your thighs (hamstring) to "dig" as hard as you can without increasing any pain. 4. Hold this position for __________ seconds. 5. Release the tension gradually and allow your muscles to relax completely for __________ seconds after each repetition. Repeat __________ times. Complete this exercise __________ times a day. Hamstring curls If told by your health care provider, do this exercise while wearing ankle weights. Begin  with __________ lb weights. Then increase the weight by 1 lb (0.5 kg) increments. Do not wear ankle weights that are more than __________ lb. 1. Lie on your abdomen with your legs straight. 2. Bend your left / right knee as far as you can without feeling pain. Keep your hips flat against the floor. 3. Hold this position for __________ seconds. 4. Slowly lower your leg to the starting position. Repeat __________ times. Complete this exercise __________ times a day. Squats This exercise strengthens the muscles in front of your thigh and knee (quadriceps). 1. Stand in front of a table, with your feet and knees pointing straight ahead. You may rest your hands on the table for balance but not for support. 2. Slowly bend your knees and lower your hips like you are going to sit in a chair. ? Keep your weight over your heels, not over your toes. ? Keep your lower legs upright so they are parallel with the table legs. ? Do not let your hips go lower than your knees. ? Do not bend lower than told by your health care provider. ? If your knee pain increases, do not bend as low. 3. Hold the squat position for __________ seconds. 4. Slowly push with your legs to return to standing. Do not use your hands to pull yourself to standing. Repeat __________ times. Complete this exercise __________ times a day. Wall slides This exercise strengthens the muscles in front  of your thigh and knee (quadriceps). 1. Lean your back against a smooth wall or door, and walk your feet out 18-24 inches (46-61 cm) from it. 2. Place your feet hip-width apart. 3. Slowly slide down the wall or door until your knees bend __________ degrees. Keep your knees over your heels, not over your toes. Keep your knees in line with your hips. 4. Hold this position for __________ seconds. Repeat __________ times. Complete this exercise __________ times a day. Straight leg raises This exercise strengthens the muscles that rotate the leg at the hip and move it away from your body (hip abductors). 1. Lie on your side with your left / right leg in the top position. Lie so your head, shoulder, knee, and hip line up. You may bend your bottom knee to help you keep your balance. 2. Roll your hips slightly forward so your hips are stacked directly over each other and your left / right knee is facing forward. 3. Leading with your heel, lift your top leg 4-6 inches (10-15 cm). You should feel the muscles in your outer hip lifting. ? Do not let your foot drift forward. ? Do not let your knee roll toward the ceiling. 4. Hold this position for __________ seconds. 5. Slowly return your leg to the starting position. 6. Let your muscles relax completely after each repetition. Repeat __________ times. Complete this exercise __________ times a day. Straight leg raises This exercise stretches the muscles that move your hips away from the front of the pelvis (hip extensors). 1. Lie on your abdomen on a firm surface. You can put a pillow under your hips if that is more comfortable. 2. Tense the muscles in your buttocks and lift your left / right leg about 4-6 inches (10-15 cm). Keep your knee straight as you lift your leg. 3. Hold this position for __________ seconds. 4. Slowly lower your leg to the starting position. 5. Let your leg relax completely after each repetition. Repeat __________ times. Complete  this exercise __________ times a day.  This information is not intended to replace advice given to you by your health care provider. Make sure you discuss any questions you have with your health care provider. Document Revised: 01/05/2018 Document Reviewed: 01/05/2018 Elsevier Patient Education  2020 [employer].

## 2019-10-06 NOTE — Progress Notes (Signed)
Patient presenting with right knee pain. Has a history of arthritis in this knee but has started working out again. Rates pain a 8/10.  Patient has started smoking daily, cigarettes. She would like to be placed on Chantix again.   Patient also requesting refills for Sumatriptan, and an RX for nasal B12 spray.   Patient flagged: Current status:  PATIENT IS OVERDUE FOR BMI FOLLOW UP PLAN BMI is estimated to be 32.6 based on the last recorded weight and height

## 2019-10-06 NOTE — Progress Notes (Signed)
Chief Complaint  Patient presents with  . Follow-up  . Medication Refill  . Knee Pain   F/u  1. Right knee pain 8/10 worse working out nothing tried wants to see ortho  Right rotator cuff issue resolved 2. HTN elevated today no meds norvasc 5 or hctz 12.5 mg qd due to fasting  3. S/p gastric bypass c/o excess skin rec disc with bariatric surgeon what to do she is exercising and doing resistance exercises  4. GAD 7 score 6 anxiety overall controlled  5.tobacco abuse smoking 2-3 cig qd and wants refill of chantix    Review of Systems  Constitutional: Positive for weight loss.       90 lbs   HENT: Negative for hearing loss.   Eyes: Negative for blurred vision.  Respiratory: Negative for shortness of breath.   Cardiovascular: Negative for chest pain.  Gastrointestinal: Negative for abdominal pain.  Musculoskeletal: Positive for joint pain.  Skin: Negative for rash.  Neurological: Negative for headaches.  Psychiatric/Behavioral: The patient is not nervous/anxious.    Past Medical History:  Diagnosis Date  . Allergy   . Anxiety   . Asthma   . Colon cancer screening   . Depression   . Eczema   . Fatigue   . Frequent headaches   . Gastric ulcer   . GERD (gastroesophageal reflux disease)   . Goiter   . History of chicken pox   . HPV in female   . Hypertension   . Insomnia   . Leg pain   . Snoring   . UTI (urinary tract infection)    Past Surgical History:  Procedure Laterality Date  . CESAREAN SECTION  2008  . COLONOSCOPY WITH PROPOFOL N/A 02/08/2018   Procedure: COLONOSCOPY WITH PROPOFOL;  Surgeon: Lin Landsman, MD;  Location: [facility] ENDOSCOPY;  Service: Gastroenterology;  Laterality: N/A;  . CYSTECTOMY     cyst on lip  . ESOPHAGOGASTRODUODENOSCOPY (EGD) WITH PROPOFOL N/A 02/08/2018   Procedure: ESOPHAGOGASTRODUODENOSCOPY (EGD) WITH PROPOFOL;  Surgeon: Lin Landsman, MD;  Location: [place];  Service: Gastroenterology;  Laterality: N/A;  . GASTRIC  BYPASS     8/18  . MASS EXCISION  06/23/2011   Procedure: EXCISION MASS;  Surgeon: Wynonia Sours, MD;  Location: [place];  Service: Orthopedics;  Laterality: Left;  left thumb and left index finger  . MULTIPLE TOOTH EXTRACTIONS  2/12  . ROTATOR CUFF REPAIR     right 09/2017 tear repair    Family History  Problem Relation Age of Onset  . Heart disease Father   . Glaucoma Father   . Thyroid disease Mother   . Hypertension Mother   . Alcohol abuse Mother   . Depression Mother   . Prostate cancer Maternal Grandfather   . Asthma Son   . Learning disabilities Son    Social History   Socioeconomic History  . Marital status: Married    Spouse name: Not on file  . Number of children: 1   . Years of education: BA  . Highest education level: Not on file  Occupational History  . Occupation: Engineer, structural   Tobacco Use  . Smoking status: Current Every Day Smoker    Packs/day: 0.50    Types: Cigarettes    Start date: 09/22/2019  . Smokeless tobacco: Never Used  Substance and Sexual Activity  . Alcohol use: Yes    Alcohol/week: 7.0 standard drinks    Types: 7 Standard drinks or equivalent per week  Comment: daily wine  . Drug use: Not Currently  . Sexual activity: Not on file  Other Topics Concern  . Not on file  Social History Narrative   Drinks about 1 cup of coffee a day, occasionally drinks a soda.    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 Difficulty of Paying Living Expenses:   Food Insecurity:   . Worried About Charity fundraiser in the Last Year:   . Arboriculturist in the Last Year:   Transportation Needs:   . Film/video editor (Medical):   Marland Kitchen Lack of Transportation (Non-Medical):   Physical Activity:   . Days of Exercise per Week:   . Minutes of Exercise per Session:   Stress:   . Feeling of Stress  :   Social Connections:   . Frequency of Communication with Friends and Family:   . Frequency of Social Gatherings with Friends and Family:   . Attends Religious Services:   . Active Member of Clubs or Organizations:   . Attends Archivist Meetings:   Marland Kitchen Marital Status:   Intimate Partner Violence:   . Fear of Current or Ex-Partner:   . Emotionally Abused:   Marland Kitchen Physically Abused:   . Sexually Abused:    Current Meds  Medication Sig  . ALPRAZolam (XANAX) 0.5 MG tablet Take 0.5 mg by mouth 2 (two) times daily as needed.   Marland Kitchen amLODipine (NORVASC) 5 MG tablet Take 1 tablet (5 mg total) by mouth daily.  Marland Kitchen CALCIUM-VITAMIN D PO Take by mouth 2 (two) times daily.  . Cholecalciferol 100 MCG (4000 UT) CAPS Take 1 capsule (4,000 Units total) by mouth daily. 4000 IU total daily  . Cyanocobalamin (B-12-SL) 1000 MCG SUBL Place 1 tablet (1,000 mcg total) under the tongue daily.  Marland Kitchen FLUoxetine (PROZAC) 20 MG capsule   . hydrochlorothiazide (HYDRODIURIL) 12.5 MG tablet Take 1 tablet (12.5 mg total) by mouth daily. In am  . latanoprost (XALATAN) 0.005 % ophthalmic solution INSTILL 1 DROP(S) IN EACH EYE ONCE DAILY AT BEDTIME  . Multiple Vitamin (MULTI-VITAMINS) TABS Take by mouth.  . SUMAtriptan (IMITREX) 50 MG tablet May repeat in 2 hours if headache persists or recurs. Max 4 pills in 24 hours  . Varenicline Tartrate (CHANTIX PO) Chantix Starting Month Box 0.5 mg (11)-1 mg (42) tablets in dose pack  . [DISCONTINUED] hydrochlorothiazide (HYDRODIURIL) 12.5 MG tablet Take 1 tablet (12.5 mg total) by mouth daily. In am  . [DISCONTINUED] SUMAtriptan (IMITREX) 50 MG tablet May repeat in 2 hours if headache persists or recurs. Max 4 pills in 24 hours   Current Facility-Administered Medications for the 10/06/19 encounter (Office Visit) with McLean-Scocuzza, [name], MD  Medication  . betamethasone acetate-betamethasone sodium phosphate (CELESTONE) injection 3 mg   Allergies  Allergen Reactions  .  Cyclinex [Tetracycline Hcl] Shortness Of Breath  . Demeclocycline Shortness Of Breath  . Doxycycline Shortness Of Breath  . Minocycline Shortness Of Breath  . Tetracycline Shortness Of Breath  . Tetracyclines & Related Shortness Of Breath   No results found for this or any previous visit (from the past 2160 hour(s)). Objective  Body mass index is 32.58 kg/m. Wt Readings from Last 3 Encounters:  10/06/19 189 lb 12.8 oz (86.1 kg)  04/07/19 175  lb (79.4 kg)  11/12/18 183 lb (83 kg)   Temp Readings from Last 3 Encounters:  10/06/19 97.7 F (36.5 C) (Oral)  09/16/18 98.3 F (36.8 C)  08/03/18 97.9 F (36.6 C) (Oral)   BP Readings from Last 3 Encounters:  10/06/19 (!) 142/88  09/16/18 130/86  08/03/18 (!) 150/90   Pulse Readings from Last 3 Encounters:  10/06/19 76  09/16/18 71  08/03/18 66    Physical Exam Vitals and nursing note reviewed.  Constitutional:      Appearance: Normal appearance. She is well-developed and well-groomed. She is obese.  HENT:     Head: Normocephalic and atraumatic.  Eyes:     Conjunctiva/sclera: Conjunctivae normal.     Pupils: Pupils are equal, round, and reactive to light.  Cardiovascular:     Rate and Rhythm: Normal rate and regular rhythm.     Heart sounds: Normal heart sounds. No murmur heard.   Pulmonary:     Effort: Pulmonary effort is normal.     Breath sounds: Normal breath sounds.  Skin:    General: Skin is warm and dry.  Neurological:     General: No focal deficit present.     Mental Status: She is alert and oriented to person, place, and time. Mental status is at baseline.     Gait: Gait normal.  Psychiatric:        Attention and Perception: Attention and perception normal.        Mood and Affect: Mood and affect normal.        Speech: Speech normal.        Behavior: Behavior normal. Behavior is cooperative.        Thought Content: Thought content normal.        Cognition and Memory: Cognition and memory normal.         Judgment: Judgment normal.     Assessment  Plan  Arthritis of knee, right - Plan: Ambulatory referral to Orthopedic surgery Dr. Harlow Mares Rotator cuff disorder, right resolved  Iron deficiency - Plan: CBC with Differential/Platelet, Iron, TIBC and Ferritin Panel Taking mvt and iron 2x per week otc   Essential hypertension - Plan: Comprehensive metabolic panel, Lipid panel, CBC with Differential/Platelet, hydrochlorothiazide (HYDRODIURIL) 12.5 MG tablet, norvasc 5  Buy cuff [employer] upper arm  Intractable migraine without aura and with status migrainosus - Plan: SUMAtriptan (IMITREX) 50 MG tablet  Tobacco abuse - Plan: varenicline (CHANTIX) 0.5 MG tablet qd x 3 days then bid x 4 days then 1 mg bid  Glaucoma, unspecified glaucoma type, unspecified laterality F/u eye md today  Anxiety F/u psych, therapy  Cont meds  S/P gastric bypass  Cont mvt  Disc excess skin with surgery   HM Flu [id number]  Tdaputd Declines covid 19  Declines HIV check Declines MMRnot immune  covid 2/2   Consider hep C testing in future disc with pt  Pap records Dr. Trinna Balloon OB/GYNGSO h/o HPV/abnormal pap -09/11/17 neg pap no HPV  Mammogram OB/GYNhad 09/11/17 [facility] negative -encouraged pt 04/07/19 to call ob/gyn to get mammogram sch 10/10/19 12 pm   Colonoscopy11/11/19 Dr. Marius Ditch due again in 2029  Ortho Dr. Malena Catholic ortho  Dr. 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 [name] McLean-Scocuzza-Internal Medicine

## 2019-10-10 DIAGNOSIS — Z1231 Encounter for screening mammogram for malignant neoplasm of breast: Secondary | ICD-10-CM | POA: Diagnosis not present

## 2019-10-10 DIAGNOSIS — Z01419 Encounter for gynecological examination (general) (routine) without abnormal findings: Secondary | ICD-10-CM | POA: Diagnosis not present

## 2019-10-10 DIAGNOSIS — Z124 Encounter for screening for malignant neoplasm of cervix: Secondary | ICD-10-CM | POA: Diagnosis not present

## 2019-10-13 DIAGNOSIS — H401132 Primary open-angle glaucoma, bilateral, moderate stage: Secondary | ICD-10-CM | POA: Diagnosis not present

## 2019-10-13 DIAGNOSIS — H401131 Primary open-angle glaucoma, bilateral, mild stage: Secondary | ICD-10-CM | POA: Diagnosis not present

## 2019-10-16 ENCOUNTER — Other Ambulatory Visit: Payer: Self-pay | Admitting: Gastroenterology

## 2019-10-16 DIAGNOSIS — K257 Chronic gastric ulcer without hemorrhage or perforation: Secondary | ICD-10-CM

## 2019-10-19 DIAGNOSIS — F329 Major depressive disorder, single episode, unspecified: Secondary | ICD-10-CM | POA: Diagnosis not present

## 2019-10-19 DIAGNOSIS — F419 Anxiety disorder, unspecified: Secondary | ICD-10-CM | POA: Diagnosis not present

## 2019-10-23 ENCOUNTER — Telehealth: Payer: Self-pay | Admitting: Internal Medicine

## 2019-10-23 NOTE — Telephone Encounter (Signed)
Need to get pap results from 10/19/19   Dr. Rolan Lipa ob/gyn

## 2019-10-24 NOTE — Telephone Encounter (Signed)
Sent to fax.

## 2019-10-26 ENCOUNTER — Encounter: Payer: Self-pay | Admitting: Internal Medicine

## 2019-10-31 DIAGNOSIS — M1711 Unilateral primary osteoarthritis, right knee: Secondary | ICD-10-CM | POA: Diagnosis not present

## 2019-11-17 DIAGNOSIS — H401131 Primary open-angle glaucoma, bilateral, mild stage: Secondary | ICD-10-CM | POA: Diagnosis not present

## 2019-11-30 DIAGNOSIS — N950 Postmenopausal bleeding: Secondary | ICD-10-CM | POA: Diagnosis not present

## 2019-11-30 DIAGNOSIS — N898 Other specified noninflammatory disorders of vagina: Secondary | ICD-10-CM | POA: Diagnosis not present

## 2019-11-30 DIAGNOSIS — R6882 Decreased libido: Secondary | ICD-10-CM | POA: Diagnosis not present

## 2019-12-08 DIAGNOSIS — G4733 Obstructive sleep apnea (adult) (pediatric): Secondary | ICD-10-CM | POA: Diagnosis not present

## 2019-12-26 ENCOUNTER — Other Ambulatory Visit: Payer: Self-pay | Admitting: Obstetrics and Gynecology

## 2019-12-26 DIAGNOSIS — N950 Postmenopausal bleeding: Secondary | ICD-10-CM | POA: Diagnosis not present

## 2019-12-26 DIAGNOSIS — N719 Inflammatory disease of uterus, unspecified: Secondary | ICD-10-CM | POA: Diagnosis not present

## 2020-01-08 ENCOUNTER — Other Ambulatory Visit: Payer: Self-pay | Admitting: Gastroenterology

## 2020-01-08 DIAGNOSIS — K257 Chronic gastric ulcer without hemorrhage or perforation: Secondary | ICD-10-CM

## 2020-01-11 DIAGNOSIS — F329 Major depressive disorder, single episode, unspecified: Secondary | ICD-10-CM | POA: Diagnosis not present

## 2020-01-11 DIAGNOSIS — F419 Anxiety disorder, unspecified: Secondary | ICD-10-CM | POA: Diagnosis not present

## 2020-01-11 DIAGNOSIS — Z79899 Other long term (current) drug therapy: Secondary | ICD-10-CM | POA: Diagnosis not present

## 2020-01-30 DIAGNOSIS — M1711 Unilateral primary osteoarthritis, right knee: Secondary | ICD-10-CM | POA: Diagnosis not present

## 2020-01-30 DIAGNOSIS — M25461 Effusion, right knee: Secondary | ICD-10-CM | POA: Diagnosis not present

## 2020-02-01 DIAGNOSIS — M25561 Pain in right knee: Secondary | ICD-10-CM | POA: Diagnosis not present

## 2020-02-01 DIAGNOSIS — M1711 Unilateral primary osteoarthritis, right knee: Secondary | ICD-10-CM | POA: Diagnosis not present

## 2020-02-15 ENCOUNTER — Other Ambulatory Visit: Payer: Self-pay

## 2020-02-15 ENCOUNTER — Ambulatory Visit (INDEPENDENT_AMBULATORY_CARE_PROVIDER_SITE_OTHER): Payer: BC Managed Care – PPO

## 2020-02-15 ENCOUNTER — Ambulatory Visit
Admission: EM | Admit: 2020-02-15 | Discharge: 2020-02-15 | Disposition: A | Discharge status: Home / Self Care | Payer: BC Managed Care – PPO | Attending: Emergency Medicine | Admitting: Emergency Medicine

## 2020-02-15 DIAGNOSIS — R058 Other specified cough: Secondary | ICD-10-CM

## 2020-02-15 DIAGNOSIS — R030 Elevated blood-pressure reading, without diagnosis of hypertension: Secondary | ICD-10-CM

## 2020-02-15 DIAGNOSIS — R059 Cough, unspecified: Secondary | ICD-10-CM

## 2020-02-15 DIAGNOSIS — J209 Acute bronchitis, unspecified: Secondary | ICD-10-CM | POA: Diagnosis not present

## 2020-02-15 DIAGNOSIS — J0100 Acute maxillary sinusitis, unspecified: Secondary | ICD-10-CM

## 2020-02-15 MED ORDER — AZITHROMYCIN 250 MG PO TABS: 250.0000 mg | ORAL_TABLET | Freq: Every day | ORAL | 0 refills | Status: DC

## 2020-02-15 NOTE — Discharge Instructions (Addendum)
Take the Zithromax as directed.  Follow up with your primary care provider if your symptoms are not improving.    Your blood pressure is elevated today at 148/103 and 139/97.  Please have this rechecked by your primary care provider in 1-2 weeks.

## 2020-02-15 NOTE — ED Triage Notes (Signed)
Patient presents to Urgent Care with complaints of cold-like symptoms, congestion since Saturday. Patient reports coughing up yellowish/brown phlegm since yesterday. Taking Nyquil/dayquil with some relief.   Denies abdominal, N/V, or diarrhea.

## 2020-02-15 NOTE — ED Provider Notes (Signed)
Roderic Palau    CSN: [id number] Arrival date & time: 02/15/20  1500      History   Chief Complaint Chief Complaint  Patient presents with  . Nasal Congestion    HPI Adrienne Savage is a 49 y.o. female.   Patient presents with chills, cough productive of yellow-brown phlegm, congestion, rhinorrhea, postnasal drip, sinus pressure x5 days.  She denies shortness of breath, vomiting, diarrhea, or other symptoms.  She is a current everyday smoker.  Her medical history includes hypertension and asthma.    The history is provided by the patient.    Past Medical History:  Diagnosis Date  . Allergy   . Anxiety   . Asthma   . Colon cancer screening   . Depression   . Eczema   . Fatigue   . Frequent headaches   . Gastric ulcer   . GERD (gastroesophageal reflux disease)   . Goiter   . History of chicken pox   . HPV in female   . Hypertension   . Insomnia   . Leg pain   . Snoring   . UTI (urinary tract infection)     Patient Active Problem List   Diagnosis Date Noted  . Glaucoma 10/06/2019  . Iron deficiency 10/06/2019  . Annual physical exam 04/07/2019  . 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  . Abdominal pain, chronic, epigastric   . Insomnia 12/22/2017  . Migraine 12/17/2017  . Hot flashes 12/17/2017  . Multiple thyroid nodules 12/17/2017  . Environmental allergies 12/17/2017  . Chronic sinusitis 12/17/2017  . 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 Obesity 01/15/2017  . Tobacco abuse 01/15/2017  . Morbid obesity with BMI of 40.0-44.9, adult ([place]) 07/18/2016  . Anxiety 05/27/2016  . Osteoarthritis of knee 05/27/2016  . Essential hypertension 09/04/2015  . Obesity with body mass index 30 or greater 08/26/2011    Past Surgical History:  Procedure Laterality Date  . CESAREAN SECTION  2008  . COLONOSCOPY WITH  PROPOFOL N/A 02/08/2018   Procedure: COLONOSCOPY WITH PROPOFOL;  Surgeon: Lin Landsman, MD;  Location[facility] ENDOSCOPY;  Service: Gastroenterology;  Laterality: N/A;  . CYSTECTOMY     cyst on lip  . ESOPHAGOGASTRODUODENOSCOPY (EGD) WITH PROPOFOL N/A 02/08/2018   Procedure: ESOPHAGOGASTRODUODENOSCOPY (EGD) WITH PROPOFOL;  Surgeon: Lin Landsman, MD;  Location: [place];  Service: Gastroenterology;  Laterality: N/A;  . GASTRIC BYPASS     8/18  . MASS EXCISION  06/23/2011   Procedure: EXCISION MASS;  Surgeon: Wynonia Sours, MD;  Location: [place];  Service: Orthopedics;  Laterality: Left;  left thumb and left index finger  . MULTIPLE TOOTH EXTRACTIONS  2/12  . ROTATOR CUFF REPAIR     right 09/2017 tear repair     OB History   No obstetric history on file.      Home Medications    Prior to Admission medications   Medication Sig Start Date End Date Taking? Authorizing Provider  ALPRAZolam Duanne Moron) 0.5 MG tablet Take 0.5 mg by mouth 2 (two) times daily as needed.  12/07/17   [provider]  amLODipine (NORVASC) 5 MG tablet Take 1 tablet (5 mg total) by mouth daily. 07/26/19   McLean-Scocuzza, [name], MD  azithromycin (ZITHROMAX) 250 MG tablet Take 1 tablet (250 mg total) by mouth daily. Take first 2 tablets together, then 1  every day until finished. 02/15/20   Sharion Balloon, NP  BRIMONIDINE TARTRATE-TIMOLOL OP Apply to eye.    [provider]  CALCIUM-VITAMIN D PO Take by mouth 2 (two) times daily.    [provider]  Cholecalciferol 100 MCG (4000 UT) CAPS Take 1 capsule (4,000 Units total) by mouth daily. 4000 IU total daily 11/12/18   McLean-Scocuzza, [name], MD  Cyanocobalamin (B-12-SL) 1000 MCG SUBL Place 1 tablet (1,000 mcg total) under the tongue daily. 11/12/18   McLean-Scocuzza, [name], MD  FLUoxetine (PROZAC) 20 MG capsule  09/12/18   [provider]  hydrochlorothiazide (HYDRODIURIL) 12.5 MG tablet Take 1 tablet  (12.5 mg total) by mouth daily. In am 10/06/19   McLean-Scocuzza, [name], MD  latanoprost (XALATAN) 0.005 % ophthalmic solution INSTILL 1 DROP(S) IN [facility] EYE ONCE DAILY AT BEDTIME Patient not taking: Reported on 10/06/2019 02/04/18   [provider]  Multiple Vitamin (MULTI-VITAMINS) TABS Take by mouth.    [provider]  omeprazole (PRILOSEC) 40 MG capsule TAKE 1 CAPSULE BY MOUTH EVERY DAY BEFORE BREAKFAST 10/17/19   Lin Landsman, MD  SUMAtriptan (IMITREX) 50 MG tablet May repeat in 2 hours if headache persists or recurs. Max 4 pills in 24 hours 10/06/19   McLean-Scocuzza, [name], MD  varenicline (CHANTIX) 0.5 MG tablet 0.5 x qd x 3 days, 0.5 bid x 4 days. Day 8 start 1 mg bid 10/06/19   McLean-Scocuzza, [name], MD  varenicline (CHANTIX) 1 MG tablet Take 1 tablet (1 mg total) by mouth 2 (two) times daily. 10/06/19   McLean-Scocuzza, [name], MD  Varenicline Tartrate (CHANTIX PO) Chantix Starting Month Box 0.5 mg (11)-1 mg (42) tablets in dose pack    [provider]    Family History Family History  Problem Relation Age of Onset  . Heart disease Father   . Glaucoma Father   . Thyroid disease Mother   . Hypertension Mother   . Alcohol abuse Mother   . Depression Mother   . Prostate cancer Maternal Grandfather   . Asthma Son   . Learning disabilities Son     Social History Social History   Tobacco Use  . Smoking status: Current Some Day Smoker    Packs/day: 0.50    Types: Cigarettes    Start date: 09/22/2019  . Smokeless tobacco: Never Used  Vaping Use  . Vaping Use: Never used  Substance Use Topics  . Alcohol use: Yes    Alcohol/week: 7.0 standard drinks    Types: 7 Standard drinks or equivalent per week    Comment: occasional wine drinker  . Drug use: Not Currently     Allergies   Cyclinex [tetracycline hcl], Demeclocycline, Doxycycline, Minocycline, Tetracycline, and Tetracyclines & related   Review of Systems Review of Systems    Constitutional: Positive for chills. Negative for fever.  HENT: Positive for congestion, postnasal drip, rhinorrhea and sinus pressure. Negative for ear pain and sore throat.   Eyes: Negative for pain and visual disturbance.  Respiratory: Positive for cough. Negative for shortness of breath.   Cardiovascular: Negative for chest pain and palpitations.  Gastrointestinal: Negative for abdominal pain, diarrhea and vomiting.  Genitourinary: Negative for dysuria and hematuria.  Musculoskeletal: Negative for arthralgias and back pain.  Skin: Negative for color change and rash.  Neurological: Negative for seizures and syncope.  All other systems reviewed and are negative.    Physical Exam Triage Vital Signs ED Triage Vitals  Enc Vitals Group  BP      Pulse      Resp      Temp      Temp src      SpO2      Weight      Height      Head Circumference      Peak Flow      Pain Score      Pain Loc      Pain Edu?      Excl. in [place]?    No data found.  Updated Vital Signs BP (!) 139/97 Comment: did not take morning meds  Pulse 71   Temp (!) 97.3 F (36.3 C) (Temporal)   Resp 16   LMP 10/08/2016 (Exact Date) Comment: neg preg test 2/27  SpO2 96%   Visual Acuity Right Eye Distance:   Left Eye Distance:   Bilateral Distance:    Right Eye Near:   Left Eye Near:    Bilateral Near:     Physical Exam Vitals and nursing note reviewed.  Constitutional:      General: She is not in acute distress.    Appearance: She is well-developed. She is obese. She is not ill-appearing.  HENT:     Head: Normocephalic and atraumatic.     Right Ear: Tympanic membrane normal.     Left Ear: Tympanic membrane normal.     Nose: Nose normal.     Mouth/Throat:     Mouth: Mucous membranes are moist.     Pharynx: Oropharynx is clear.  Eyes:     Conjunctiva/sclera: Conjunctivae normal.  Cardiovascular:     Rate and Rhythm: Normal rate and regular rhythm.     Heart sounds: Normal heart sounds.   Pulmonary:     Effort: Pulmonary effort is normal. No respiratory distress.     Breath sounds: Rhonchi present.     Comments: Scattered rhonchi throughout.  Abdominal:     Palpations: Abdomen is soft.     Tenderness: There is no abdominal tenderness.  Musculoskeletal:     Cervical back: Neck supple.  Skin:    General: Skin is warm and dry.     Findings: No rash.  Neurological:     General: No focal deficit present.     Mental Status: She is alert and oriented to person, place, and time.     Gait: Gait normal.  Psychiatric:        Mood and Affect: Mood normal.        Behavior: Behavior normal.      UC Treatments / Results  Labs (all labs ordered are listed, but only abnormal results are displayed) Labs Reviewed  COVID-19, FLU A+B AND RSV    EKG   Radiology DG Chest 2 View  Result Date: 02/15/2020 CLINICAL DATA:  Productive cough, history of smoking EXAM: CHEST - 2 VIEW COMPARISON:  January 01, 2011 FINDINGS: Trachea midline. Cardiomediastinal contours and hilar structures are normal. Lungs are clear. No sign of effusion. On limited assessment no acute skeletal process. IMPRESSION: No acute cardiopulmonary disease. Electronically Signed   By: Zetta Bills M.D.   On: 02/15/2020 16:07    Procedures Procedures (including critical care time)  Medications Ordered in UC Medications - No data to display  Initial Impression / Assessment and Plan / UC Course  I have reviewed the triage vital signs and the nursing notes.  Pertinent labs & imaging results that were available during my care of the patient were reviewed by me  and considered in my medical decision making (see chart for details).   Productive cough, acute sinusitis, acute bronchitis.  Elevated blood pressure reading.  Chest x-ray negative.  Treating with Zithromax.  Instructed patient to follow-up with her PCP if her symptoms or not improving.  Discussed that her blood pressure is elevated today needs to be  rechecked by her PCP in 1 to 2 weeks.  Patient agrees to plan of care.   Final Clinical Impressions(s) / UC Diagnoses   Final diagnoses:  Productive cough  Acute non-recurrent maxillary sinusitis  Acute bronchitis, unspecified organism  Elevated blood pressure reading     Discharge Instructions     Take the Zithromax as directed.  Follow up with your primary care provider if your symptoms are not improving.    Your blood pressure is elevated today at 148/103 and 139/97.  Please have this rechecked by your primary care provider in 1-2 weeks.         ED Prescriptions    Medication Sig Dispense Auth. Provider   azithromycin (ZITHROMAX) 250 MG tablet Take 1 tablet (250 mg total) by mouth daily. Take first 2 tablets together, then 1 every day until finished. 6 tablet Sharion Balloon, NP     PDMP not reviewed this encounter.   Sharion Balloon, NP 02/15/20 1616

## 2020-02-17 LAB — COVID-19, FLU A+B AND RSV
Influenza A, NAA: NOT DETECTED
Influenza B, NAA: NOT DETECTED
RSV, NAA: NOT DETECTED
SARS-CoV-2, NAA: NOT DETECTED

## 2020-02-22 DIAGNOSIS — M1711 Unilateral primary osteoarthritis, right knee: Secondary | ICD-10-CM | POA: Diagnosis not present

## 2020-02-22 DIAGNOSIS — M25561 Pain in right knee: Secondary | ICD-10-CM | POA: Diagnosis not present

## 2020-02-25 DIAGNOSIS — M1711 Unilateral primary osteoarthritis, right knee: Secondary | ICD-10-CM | POA: Diagnosis not present

## 2020-02-25 DIAGNOSIS — M25561 Pain in right knee: Secondary | ICD-10-CM | POA: Diagnosis not present

## 2020-02-28 DIAGNOSIS — M25561 Pain in right knee: Secondary | ICD-10-CM | POA: Diagnosis not present

## 2020-02-28 DIAGNOSIS — M1711 Unilateral primary osteoarthritis, right knee: Secondary | ICD-10-CM | POA: Diagnosis not present

## 2020-03-06 ENCOUNTER — Other Ambulatory Visit: Payer: Self-pay | Admitting: Gastroenterology

## 2020-03-06 DIAGNOSIS — K257 Chronic gastric ulcer without hemorrhage or perforation: Secondary | ICD-10-CM

## 2020-03-07 ENCOUNTER — Other Ambulatory Visit: Payer: Self-pay | Admitting: Gastroenterology

## 2020-03-07 DIAGNOSIS — K257 Chronic gastric ulcer without hemorrhage or perforation: Secondary | ICD-10-CM

## 2020-03-07 NOTE — Telephone Encounter (Signed)
Patient has not been seen since 2020

## 2020-03-13 DIAGNOSIS — H401132 Primary open-angle glaucoma, bilateral, moderate stage: Secondary | ICD-10-CM | POA: Diagnosis not present

## 2020-03-25 ENCOUNTER — Other Ambulatory Visit: Payer: Self-pay | Admitting: Gastroenterology

## 2020-03-25 DIAGNOSIS — K257 Chronic gastric ulcer without hemorrhage or perforation: Secondary | ICD-10-CM

## 2020-04-10 DIAGNOSIS — F419 Anxiety disorder, unspecified: Secondary | ICD-10-CM | POA: Diagnosis not present

## 2020-04-10 DIAGNOSIS — F329 Major depressive disorder, single episode, unspecified: Secondary | ICD-10-CM | POA: Diagnosis not present

## 2020-05-01 ENCOUNTER — Encounter: Payer: Self-pay | Admitting: Gastroenterology

## 2020-05-01 ENCOUNTER — Ambulatory Visit: Payer: BC Managed Care – PPO | Admitting: Gastroenterology

## 2020-05-01 ENCOUNTER — Other Ambulatory Visit: Payer: Self-pay

## 2020-05-01 VITALS — BP 123/85 | HR 78 | Temp 98.5°F | Ht 64.0 in | Wt 199.2 lb

## 2020-05-01 DIAGNOSIS — K287 Chronic gastrojejunal ulcer without hemorrhage or perforation: Secondary | ICD-10-CM

## 2020-05-01 MED ORDER — OMEPRAZOLE 40 MG PO CPDR: DELAYED_RELEASE_CAPSULE | ORAL | 1 refills | Status: DC

## 2020-05-01 NOTE — Progress Notes (Signed)
Open  Adrienne Darby, MD [address]  [place]  [place], [place] [address]  Main: [phone]  Fax: [phone]    Gastroenterology Consultation  Referring Provider:     McLean-Scocuzza, [name] * Primary Care Physician:  McLean-Scocuzza, [name], MD Primary Gastroenterologist:  Dr. Cephas Savage Reason for Consultation:   chronic epigastric pain, anastomotic ulcer        HPI:   Adrienne Savage is a 50 y.o. female referred by Dr. Terese Door, [name], MD  for consultation & management of colon cancer screening and chronic epigastric pain.  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  She lost about 64 pounds since then.  She reports that, since surgery she has been experiencing chronic epigastric pain.  Underwent 2 upper endoscopies by her bariatric surgeon.  She was told that she had anastomotic ulcer.  She has been taking Protonix 40 mg daily in addition to Pepcid and sucralfate.  She went to ER in 10/2017 secondary to left upper quadrant discomfort, underwent CT which revealed possible gastric thickening and soft tissue edema.  She has been treated as possible gastritis by her bariatric surgeon.  And scheduled for upper endoscopy next week.  Patient denies any other upper GI symptoms.  She recently quit smoking and on Chantix.  She is also due for colonoscopy for colon cancer screening.  She denies any lower GI symptoms.  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  I started her on omeprazole 40 mg twice daily.  Patient reports moderate improvement in her symptoms.  She continues to stay away from tobacco.  Follow-up visit 06/04/2018 She reports feeling lethargic, denies black stools.  Otherwise, denies epigastric pain.  Continues to gain weight.  She reports eating more red meat and high carb foods, less physically active.  She is taking bariatric multivitamin with minerals daily.  Her PCP ordered basic  labs including iron studies, TSH last month.  She reported she is going to get them done next week.  She is not smoking.  Follow-up visit 09/16/2018 Adrienne Savage was admitted to [facility] in 07/2018 when she presented with acute abdominal pain, initially went to [facility] ER, CT abdomen and pelvis was concerning for small bowel intussusception, therefore she was transferred to [facility].  W, she underwent x-ray upper GI series with small bowel follow-through which did not reveal evidence of intussusception.  She was managed conservatively and her pain thought to be secondary to severe constipation, managed with aggressive bowel regimen and was discharged home.  Since then, patient reports that her pain modestly improved.  She continues to experience sharp/squeezing upper abdominal pain associated with significant bloating.  The pain is not as intense or radiating to the back compared to recent episode. She always has irregular bowel habits and currently taking MiraLAX once a day and Senokot.  She also reports feeling gassy and burping.  She is taking omeprazole 40 mg twice daily  Follow-up visit 05/01/2020  Patient reports doing well.  She is here to request refill on omeprazole which she has been taking for gastrojejunal anastomotic ulcer.  She reports that she injured her right knee when she attended [facility], resulted in about 20 pound weight loss.  She regained about 10 pounds, currently trying to follow a keto and paleo diet, with calorie restriction to 1300 cal/day.  She is trying to be active.  She denies any GI complaints today.  NSAIDs: None  Antiplts/Anticoagulants/Anti  thrombotics: None  GI Procedures:  Upper endoscopy 02/08/2018 - Roux-en-Y gastrojejunostomy with gastrojejunal anastomosis characterized by ulceration. - Normal gastric body. - Normal gastroesophageal junction and esophagus. - No specimens collected.  Colonoscopy 02/08/2018 - The entire examined colon is normal. - The distal rectum and anal  verge are normal on retroflexion view. - No specimens collected.  Upper endoscopy in 06/2017, report not available  Upper endoscopy at [facility] on 07/18/2016, report not available Pathology A. 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 No Helicobacter pylori organisms are seen on routine H&E stain. No intestinal metaplasia, dysplasia, or carcinoma is seen.  Past Medical History:  Diagnosis Date  . Allergy   . Anxiety   . Asthma   . Colon cancer screening   . Depression   . Eczema   . Fatigue   . Frequent headaches   . Gastric ulcer   . GERD (gastroesophageal reflux disease)   . Goiter   . History of chicken pox   . HPV in female   . Hypertension   . Insomnia   . Leg pain   . Snoring   . UTI (urinary tract infection)     Past Surgical History:  Procedure Laterality Date  . CESAREAN SECTION  2008  . COLONOSCOPY WITH PROPOFOL N/A 02/08/2018   Procedure: COLONOSCOPY WITH PROPOFOL;  Surgeon: Lin Landsman, MD;  Location: [facility] ENDOSCOPY;  Service: Gastroenterology;  Laterality: N/A;  . CYSTECTOMY     cyst on lip  . ESOPHAGOGASTRODUODENOSCOPY (EGD) WITH PROPOFOL N/A 02/08/2018   Procedure: ESOPHAGOGASTRODUODENOSCOPY (EGD) WITH PROPOFOL;  Surgeon: Lin Landsman, MD;  Location: [place];  Service: Gastroenterology;  Laterality: N/A;  . GASTRIC BYPASS     8/18  . MASS EXCISION  06/23/2011   Procedure: EXCISION MASS;  Surgeon: Wynonia Sours, MD;  Location: [place];  Service: Orthopedics;  Laterality: Left;  left thumb and left index finger  . MULTIPLE TOOTH EXTRACTIONS  2/12  . ROTATOR CUFF REPAIR     right 09/2017 tear repair     Current Outpatient Medications:  .  ALPRAZolam (XANAX) 0.5 MG tablet, Take 0.5 mg by mouth 2 (two) times daily as needed. , Disp: , Rfl:  .  amLODipine (NORVASC) 5 MG tablet, Take 1 tablet (5 mg total) by mouth daily.,  Disp: 90 tablet, Rfl: 3 .  BRIMONIDINE TARTRATE-TIMOLOL OP, Apply to eye., Disp: , Rfl:  .  CALCIUM-VITAMIN D PO, Take by mouth 2 (two) times daily., Disp: , Rfl:  .  FLUoxetine (PROZAC) 20 MG capsule, , Disp: , Rfl:  .  hydrochlorothiazide (HYDRODIURIL) 12.5 MG tablet, Take 1 tablet (12.5 mg total) by mouth daily. In am, Disp: 90 tablet, Rfl: 3 .  latanoprost (XALATAN) 0.005 % ophthalmic solution, INSTILL 1 DROP(S) IN EACH EYE ONCE DAILY AT BEDTIME, Disp: , Rfl: 6 .  Multiple Vitamin (MULTI-VITAMINS) TABS, Take by mouth., Disp: , Rfl:  .  SUMAtriptan (IMITREX) 50 MG tablet, May repeat in 2 hours if headache persists or recurs. Max 4 pills in 24 hours, Disp: 10 tablet, Rfl: 5 .  omeprazole (PRILOSEC) 40 MG capsule, TAKE 1 CAPSULE BY MOUTH EVERY DAY BEFORE BREAKFAST, Disp: 90 capsule, Rfl: 1  Current Facility-Administered Medications:  .  betamethasone acetate-betamethasone sodium phosphate (CELESTONE) injection 3 mg, 3 mg, Intramuscular, Once, Edrick Kins, DPM   Family History  Problem Relation Age of Onset  . Heart disease Father   .  Glaucoma Father   . Thyroid disease Mother   . Hypertension Mother   . Alcohol abuse Mother   . Depression Mother   . Prostate cancer Maternal Grandfather   . Asthma Son   . Learning disabilities Son      Social History   Tobacco Use  . Smoking status: Current Some Day Smoker    Packs/day: 0.50    Types: Cigarettes    Start date: 09/22/2019  . Smokeless tobacco: Never Used  Vaping Use  . Vaping Use: Never used  Substance Use Topics  . Alcohol use: Yes    Alcohol/week: 7.0 standard drinks    Types: 7 Standard drinks or equivalent per week    Comment: occasional wine drinker  . Drug use: Not Currently    Allergies as of 05/01/2020 - Review Complete 05/01/2020  Allergen Reaction Noted  . Cyclinex [tetracycline hcl] Shortness Of Breath 06/16/2011  . Demeclocycline Shortness Of Breath 06/16/2011  . Doxycycline Shortness Of Breath 06/16/2011   . Minocycline Shortness Of Breath 06/16/2011  . Tetracycline Shortness Of Breath 09/04/2015  . Tetracyclines & related Shortness Of Breath 06/16/2011    Review of Systems:    All systems reviewed and negative except where noted in HPI.   Physical Exam:  BP 123/85 (BP Location: Left Arm, Patient Position: Sitting, Cuff Size: Normal)   Pulse 78   Temp 98.5 F (36.9 C) (Oral)   Ht 5\' 4"  (1.626 m)   Wt 199 lb 4 oz (90.4 kg)   LMP 10/08/2016 (Exact Date) Comment: neg preg test 2/27  BMI 34.20 kg/m  Patient's last menstrual period was 10/08/2016 (exact date).  General:   Alert,  Well-developed, well-nourished, pleasant and cooperative in NAD Head:  Normocephalic and atraumatic. Eyes:  Sclera clear, no icterus.   Conjunctiva pink. Ears:  Normal auditory acuity. Nose:  No deformity, discharge, or lesions. Mouth:  No deformity or lesions,oropharynx pink & moist. Neck:  Supple; no masses or thyromegaly. Lungs:  Respirations even and unlabored.  Clear throughout to auscultation.   No wheezes, crackles, or rhonchi. No acute distress. Heart:  Regular rate and rhythm; no murmurs, clicks, rubs, or gallops. Abdomen:  Normal bowel sounds. Soft, mild to moderate epigastric tenderness and mildly distended, tympanic without masses, hepatosplenomegaly or hernias noted.  No guarding or rebound tenderness.   Rectal: Not performed Msk:  Symmetrical without gross deformities. Good, equal movement & strength bilaterally. Pulses:  Normal pulses noted. Extremities:  No clubbing or edema.  No cyanosis. Neurologic:  Alert and oriented x3;  grossly normal neurologically. Skin:  Intact without significant lesions or rashes. No jaundice. Psych:  Alert and cooperative. Normal mood and affect.  Imaging Studies: Reviewed  Assessment and Plan:   Adrienne Savage is a 50 y.o. African-American female with history of Roux-en-Y gastric bypass in 10/2016 at [facility] here for follow-up of acute on chronic epigastric  pain.  She had possible small bowel intussusception which was transient but the repeat imaging was unremarkable.  Differentials include recurrence of the anastomotic ulcer or small intestinal bacterial overgrowth or secondary to chronic constipation.   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  Continue omeprazole 40 mg once daily, cautioned about rebound reflux associated with discontinuation of medication and can be bridged with low-dose omeprazole or over-the-counter Pepcid  Colon cancer screening: Normal colonoscopy, recommend surveillance in 2029   Follow up as needed   Adrienne Savage  Smitty Pluck, MD

## 2020-05-30 ENCOUNTER — Other Ambulatory Visit: Payer: Self-pay | Admitting: Internal Medicine

## 2020-05-30 DIAGNOSIS — I10 Essential (primary) hypertension: Secondary | ICD-10-CM

## 2020-06-04 DIAGNOSIS — M1711 Unilateral primary osteoarthritis, right knee: Secondary | ICD-10-CM | POA: Diagnosis not present

## 2020-06-06 ENCOUNTER — Other Ambulatory Visit: Payer: Self-pay | Admitting: Orthopedic Surgery

## 2020-06-06 DIAGNOSIS — M25561 Pain in right knee: Secondary | ICD-10-CM

## 2020-06-29 ENCOUNTER — Other Ambulatory Visit: Payer: Self-pay

## 2020-06-29 ENCOUNTER — Ambulatory Visit
Admission: RE | Admit: 2020-06-29 | Discharge: 2020-06-29 | Disposition: A | Discharge status: Home / Self Care | Payer: BC Managed Care – PPO | Source: Ambulatory Visit | Attending: Orthopedic Surgery | Admitting: Orthopedic Surgery

## 2020-06-29 DIAGNOSIS — M25561 Pain in right knee: Secondary | ICD-10-CM | POA: Insufficient documentation

## 2020-06-29 DIAGNOSIS — S83241A Other tear of medial meniscus, current injury, right knee, initial encounter: Secondary | ICD-10-CM | POA: Diagnosis not present

## 2020-06-29 DIAGNOSIS — M7989 Other specified soft tissue disorders: Secondary | ICD-10-CM | POA: Diagnosis not present

## 2020-06-29 DIAGNOSIS — M25461 Effusion, right knee: Secondary | ICD-10-CM | POA: Diagnosis not present

## 2020-07-03 DIAGNOSIS — F329 Major depressive disorder, single episode, unspecified: Secondary | ICD-10-CM | POA: Diagnosis not present

## 2020-07-03 DIAGNOSIS — F419 Anxiety disorder, unspecified: Secondary | ICD-10-CM | POA: Diagnosis not present

## 2020-07-03 DIAGNOSIS — M1711 Unilateral primary osteoarthritis, right knee: Secondary | ICD-10-CM | POA: Diagnosis not present

## 2020-07-05 DIAGNOSIS — M659 Synovitis and tenosynovitis, unspecified: Secondary | ICD-10-CM | POA: Diagnosis not present

## 2020-07-05 DIAGNOSIS — M7050 Other bursitis of knee, unspecified knee: Secondary | ICD-10-CM | POA: Diagnosis not present

## 2020-07-05 DIAGNOSIS — M1711 Unilateral primary osteoarthritis, right knee: Secondary | ICD-10-CM | POA: Diagnosis not present

## 2020-07-05 DIAGNOSIS — M25461 Effusion, right knee: Secondary | ICD-10-CM | POA: Diagnosis not present

## 2020-07-05 DIAGNOSIS — Z79899 Other long term (current) drug therapy: Secondary | ICD-10-CM | POA: Diagnosis not present

## 2020-07-30 DIAGNOSIS — Z20822 Contact with and (suspected) exposure to covid-19: Secondary | ICD-10-CM | POA: Diagnosis not present

## 2020-07-31 ENCOUNTER — Encounter: Payer: Self-pay | Admitting: Adult Health

## 2020-07-31 ENCOUNTER — Telehealth (INDEPENDENT_AMBULATORY_CARE_PROVIDER_SITE_OTHER): Payer: BC Managed Care – PPO | Admitting: Adult Health

## 2020-07-31 VITALS — Ht 64.02 in | Wt 194.0 lb

## 2020-07-31 DIAGNOSIS — J069 Acute upper respiratory infection, unspecified: Secondary | ICD-10-CM

## 2020-07-31 DIAGNOSIS — Z20822 Contact with and (suspected) exposure to covid-19: Secondary | ICD-10-CM | POA: Diagnosis not present

## 2020-07-31 NOTE — Progress Notes (Addendum)
Virtual Visit via Video Note  I connected with Adrienne Savage on 07/31/20 at  4:30 PM EDT by a video enabled telemedicine application and verified that I am speaking with the correct person using two identifiers. Parties involved in visit as below:   Location: Patient: at home  Provider: Provider: Provider's office at  [facility], [place].     I discussed the limitations of evaluation and management by telemedicine and the availability of in person appointments. The patient expressed understanding and agreed to proceed.  History of Present Illness: Patient is a 50 year old female in no acute distress who comes to the clinic by virtual visit for  body aches,sorethroat, nasal congestion and body aches for 2- 3 days. Covid test yesterday was inconclusive she reports.  She has cough, congestion, sweating and fatigue. Started as a headache that has now resolved. Low grade fever x 2 days. She has no thermometer.  She had repeat test for covid today.  She has some mild chest tightness from coughing. Denies any chest pain.  Declines need for inhaler.  Taking Nyquil/ Dayquil  Patient  denies any fever, body aches,chills, rash, chest pain, shortness of breath, nausea, vomiting, or diarrhea.   Denies any known exposures. Denies sore throat.  Denies dizziness, lightheadedness, pre syncopal or syncopal episodes.   Observations/Objective:  Patient is alert and oriented and responsive to questions Engages in conversation with provider. Speaks in full sentences without any pauses without any shortness of breath or distress.   Assessment and Plan: Viral upper respiratory tract infection  Suspected COVID-19 virus infection   Discussed symptoms of viral upper respiratory infection, offered inhaler if needed patient declined " feel I am doing good with symptoms I just want to know if I have covid"  Advised she could go to [facility] in [place] 9-5 and  or call the office here in the morning to schedule a drive up test.   Red Flags discussed. The patient was given clear instructions to go to ER or return to medical center if any red flags develop, symptoms do not improve, worsen or new problems develop. They verbalized understanding. Denies any cough medicine feels that over the counter is working well.   Seek in person care immediately if any symptoms worsen or change at ANYTIME is advised.  Follow Up Instructions:   Red Flags discussed. The patient was given clear instructions to go to ER or return to medical center if any red flags develop, symptoms do not improve, worsen or new problems develop. They verbalized understanding.  I discussed the assessment and treatment plan with the patient. The patient was provided an opportunity to ask questions and all were answered. The patient agreed with the plan and demonstrated an understanding of the instructions.   The patient was advised to call back or seek an in-person evaluation if the symptoms worsen or if the condition fails to improve as anticipated.  Return in about 3 days (around 08/03/2020), or if symptoms worsen or fail to improve, for at any time for any worsening symptoms, Go to Emergency room/ urgent care if worse.   Marcille Buffy, FNP

## 2020-07-31 NOTE — Patient Instructions (Signed)

## 2020-08-01 ENCOUNTER — Telehealth: Payer: Self-pay | Admitting: Internal Medicine

## 2020-08-01 DIAGNOSIS — Z03818 Encounter for observation for suspected exposure to other biological agents ruled out: Secondary | ICD-10-CM | POA: Diagnosis not present

## 2020-08-01 DIAGNOSIS — Z20822 Contact with and (suspected) exposure to covid-19: Secondary | ICD-10-CM | POA: Diagnosis not present

## 2020-08-01 NOTE — Telephone Encounter (Signed)
Since she is only on day three of symptoms, recommend symptomatic treatment she can still take the Nyquil/ dayquil and may add Mucniex all per package instructions. Nasal saline per package instructions.   Follow up if symptoms last longer than 10 days, or if any worsning symptoms at anytime recommend an in person evaluation at medical facility immediately.

## 2020-08-01 NOTE — Telephone Encounter (Signed)
Pt called and wanted to follow up on her visit. She got her covid test back and it was negative but she is having symptoms and wanted to know what she needed to take

## 2020-08-02 NOTE — Telephone Encounter (Signed)
Call could not be completed at this time. Could not leave message.

## 2020-08-03 NOTE — Telephone Encounter (Signed)
Call could not be completed at this time. Could not leave message. 

## 2020-08-05 ENCOUNTER — Other Ambulatory Visit: Payer: Self-pay | Admitting: Internal Medicine

## 2020-08-05 DIAGNOSIS — I10 Essential (primary) hypertension: Secondary | ICD-10-CM

## 2020-08-06 ENCOUNTER — Other Ambulatory Visit: Payer: Self-pay | Admitting: Adult Health

## 2020-08-06 ENCOUNTER — Encounter: Payer: Self-pay | Admitting: Adult Health

## 2020-08-06 DIAGNOSIS — B9689 Other specified bacterial agents as the cause of diseases classified elsewhere: Secondary | ICD-10-CM

## 2020-08-06 MED ORDER — AMOXICILLIN-POT CLAVULANATE 875-125 MG PO TABS: 1.0000 | ORAL_TABLET | Freq: Two times a day (BID) | ORAL | 0 refills | Status: DC

## 2020-08-06 NOTE — Progress Notes (Signed)
Bacterial upper respiratory infection - Plan: amoxicillin-clavulanate (AUGMENTIN) 875-125 MG tablet

## 2020-08-07 NOTE — Telephone Encounter (Signed)
Call could not be completed at this time. Letter has been sent with provider instruction.

## 2020-08-16 DIAGNOSIS — Z79899 Other long term (current) drug therapy: Secondary | ICD-10-CM | POA: Diagnosis not present

## 2020-08-16 DIAGNOSIS — M064 Inflammatory polyarthropathy: Secondary | ICD-10-CM | POA: Diagnosis not present

## 2020-08-16 DIAGNOSIS — M7050 Other bursitis of knee, unspecified knee: Secondary | ICD-10-CM | POA: Diagnosis not present

## 2020-09-17 ENCOUNTER — Other Ambulatory Visit: Payer: Self-pay | Admitting: Gastroenterology

## 2020-09-17 DIAGNOSIS — K287 Chronic gastrojejunal ulcer without hemorrhage or perforation: Secondary | ICD-10-CM

## 2020-10-15 DIAGNOSIS — Z6832 Body mass index (BMI) 32.0-32.9, adult: Secondary | ICD-10-CM | POA: Diagnosis not present

## 2020-10-15 DIAGNOSIS — Z01419 Encounter for gynecological examination (general) (routine) without abnormal findings: Secondary | ICD-10-CM | POA: Diagnosis not present

## 2020-10-15 DIAGNOSIS — H401132 Primary open-angle glaucoma, bilateral, moderate stage: Secondary | ICD-10-CM | POA: Diagnosis not present

## 2020-10-15 DIAGNOSIS — Z113 Encounter for screening for infections with a predominantly sexual mode of transmission: Secondary | ICD-10-CM | POA: Diagnosis not present

## 2020-10-15 DIAGNOSIS — Z1231 Encounter for screening mammogram for malignant neoplasm of breast: Secondary | ICD-10-CM | POA: Diagnosis not present

## 2020-10-15 DIAGNOSIS — Z124 Encounter for screening for malignant neoplasm of cervix: Secondary | ICD-10-CM | POA: Diagnosis not present

## 2020-10-17 DIAGNOSIS — F329 Major depressive disorder, single episode, unspecified: Secondary | ICD-10-CM | POA: Diagnosis not present

## 2020-10-17 DIAGNOSIS — F419 Anxiety disorder, unspecified: Secondary | ICD-10-CM | POA: Diagnosis not present

## 2020-10-17 LAB — HM PAP SMEAR: HM Pap smear: NORMAL

## 2020-10-24 DIAGNOSIS — E669 Obesity, unspecified: Secondary | ICD-10-CM | POA: Diagnosis not present

## 2020-10-24 DIAGNOSIS — M064 Inflammatory polyarthropathy: Secondary | ICD-10-CM | POA: Diagnosis not present

## 2020-10-24 DIAGNOSIS — Z79899 Other long term (current) drug therapy: Secondary | ICD-10-CM | POA: Diagnosis not present

## 2020-11-26 IMAGING — DX DG CHEST 2V
2 series · 2 of 2 positions shown · non-contrast
Comparison: January 01, 2011

CLINICAL DATA: Productive cough, history of smoking

EXAM:
CHEST - 2 VIEW

[chest pa]
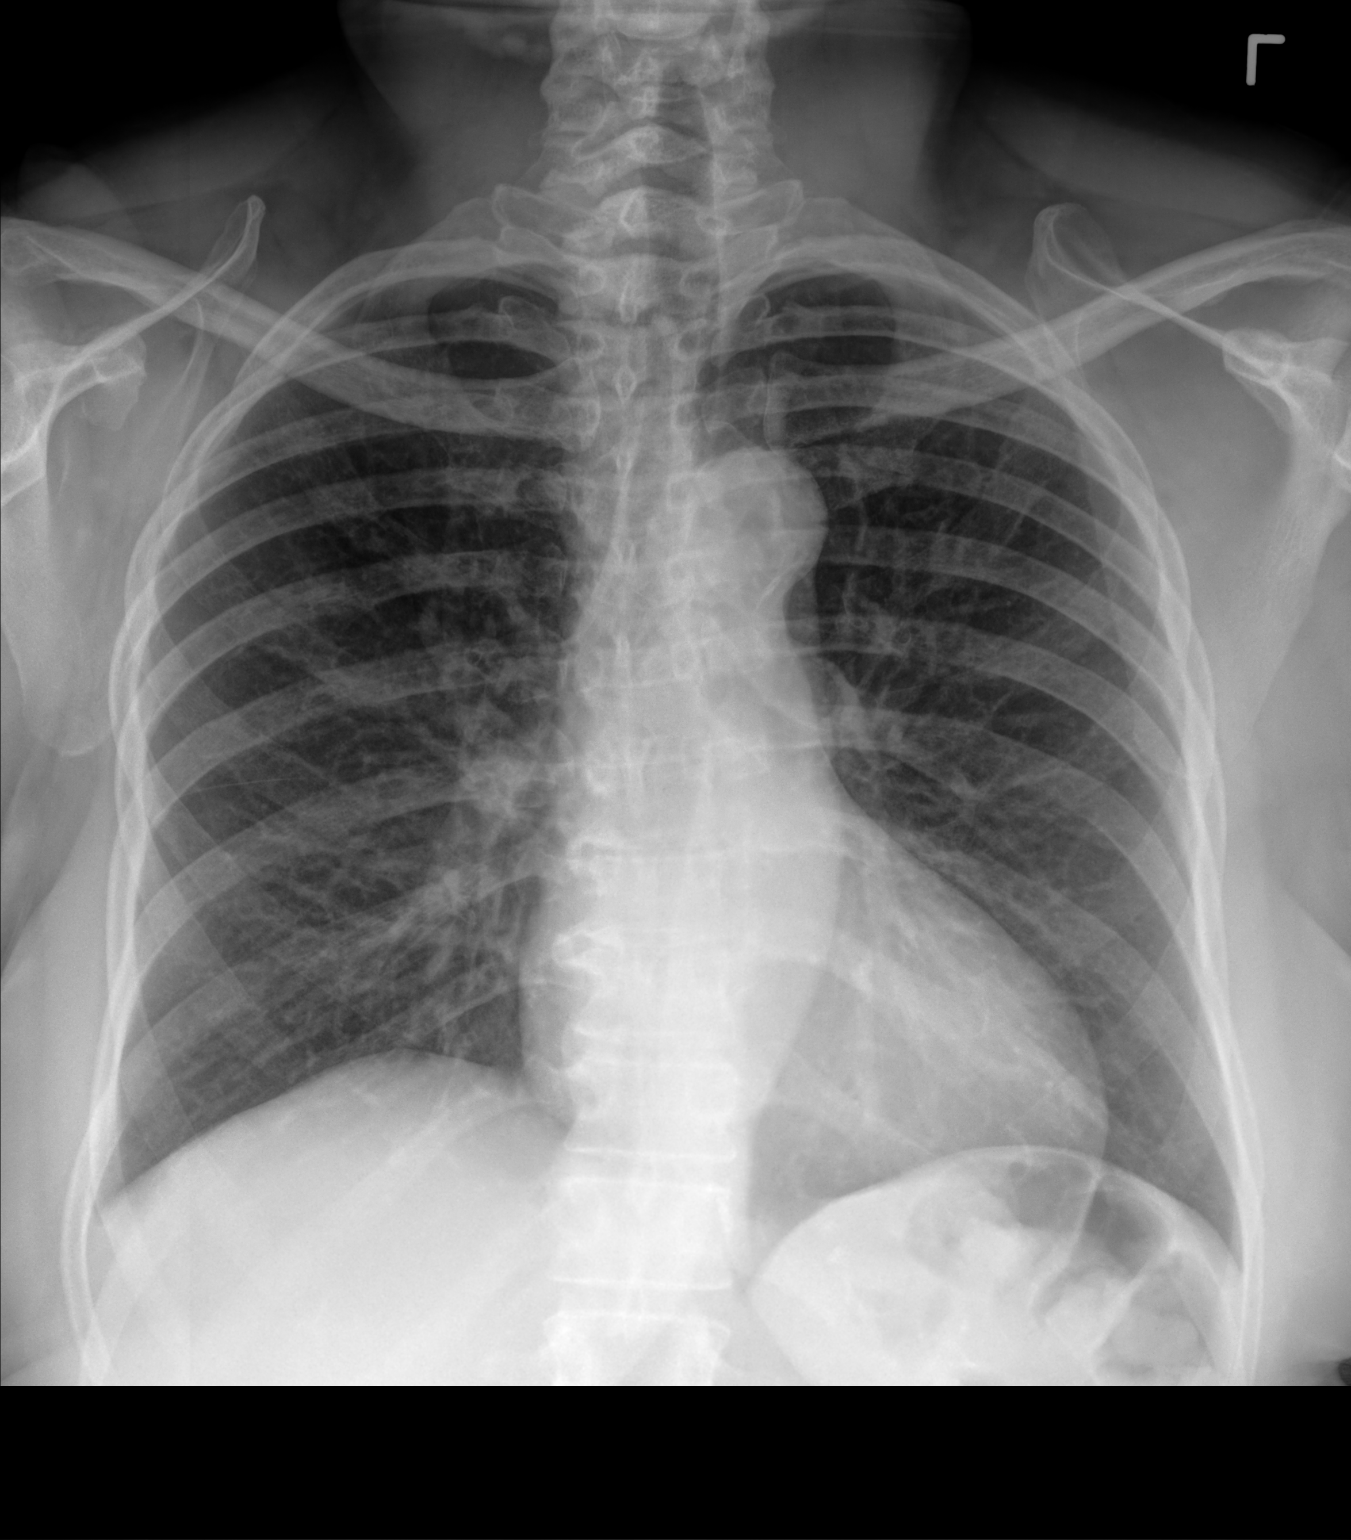

[chest lat]
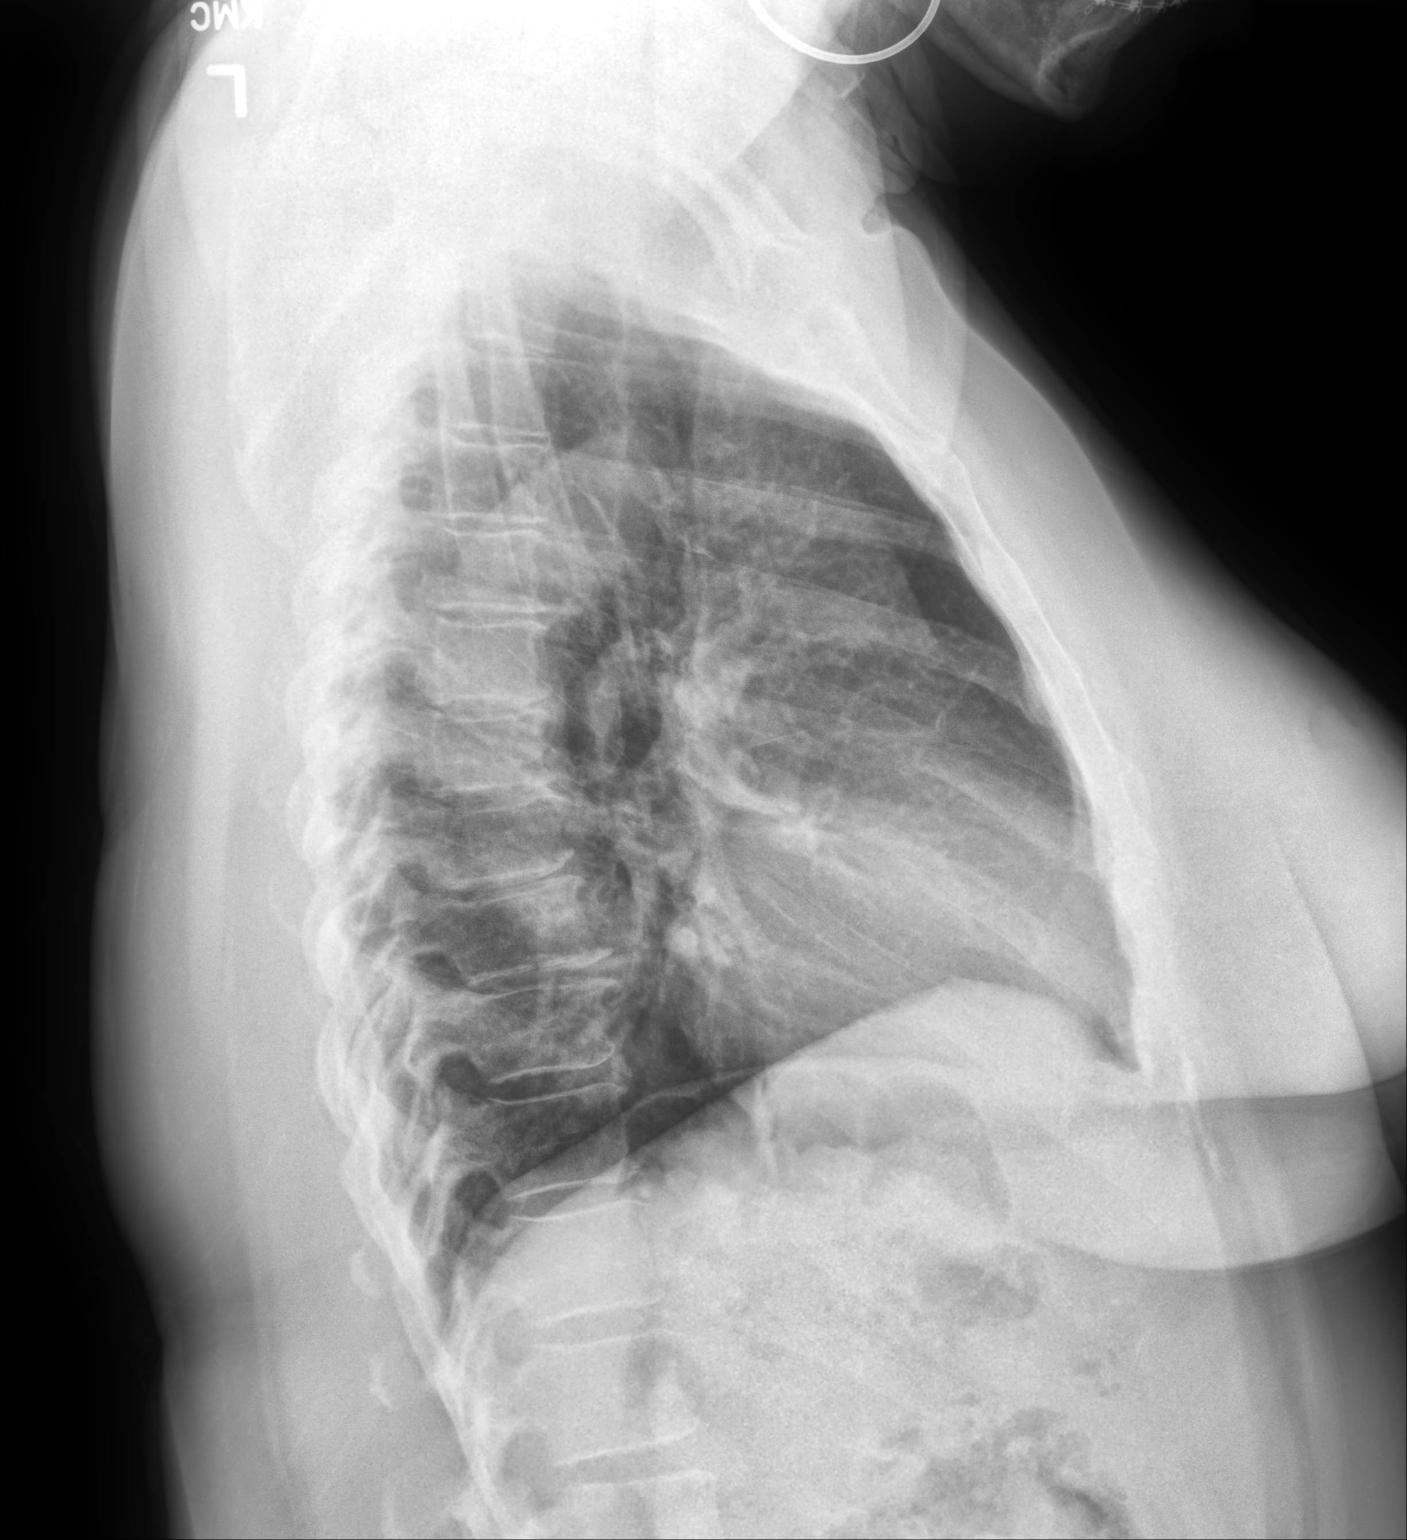

[2 of 2 positions shown; findings below may reference images not displayed]

FINDINGS: Trachea midline. Cardiomediastinal contours and hilar structures are
normal.

Lungs are clear. No sign of effusion. On limited assessment no acute
skeletal process.
IMPRESSION: No acute cardiopulmonary disease.

## 2020-11-27 ENCOUNTER — Other Ambulatory Visit: Payer: Self-pay | Admitting: Internal Medicine

## 2020-11-27 DIAGNOSIS — I10 Essential (primary) hypertension: Secondary | ICD-10-CM

## 2020-12-18 DIAGNOSIS — R928 Other abnormal and inconclusive findings on diagnostic imaging of breast: Secondary | ICD-10-CM | POA: Diagnosis not present

## 2020-12-18 LAB — HM MAMMOGRAPHY

## 2020-12-21 ENCOUNTER — Other Ambulatory Visit: Payer: Self-pay

## 2020-12-21 ENCOUNTER — Encounter: Payer: Self-pay | Admitting: Emergency Medicine

## 2020-12-21 ENCOUNTER — Ambulatory Visit
Admission: EM | Admit: 2020-12-21 | Discharge: 2020-12-21 | Disposition: A | Discharge status: Home / Self Care | Payer: BC Managed Care – PPO | Attending: Emergency Medicine | Admitting: Emergency Medicine

## 2020-12-21 DIAGNOSIS — B349 Viral infection, unspecified: Secondary | ICD-10-CM | POA: Diagnosis not present

## 2020-12-21 DIAGNOSIS — Z20822 Contact with and (suspected) exposure to covid-19: Secondary | ICD-10-CM | POA: Diagnosis not present

## 2020-12-21 MED ORDER — ACETAMINOPHEN 325 MG PO TABS
650.0000 mg | ORAL_TABLET | Freq: Once | ORAL | Status: AC
  Administered 2020-12-21: 650 mg via ORAL

## 2020-12-21 NOTE — ED Provider Notes (Signed)
Roderic Palau    CSN: [id number] Arrival date & time: 12/21/20  1122      History   Chief Complaint Chief Complaint  Patient presents with   Facial Pain   Nasal Congestion   Fatigue    HPI Dru Laurel is a 50 y.o. female.  Patient presents with fever, chills, fatigue, body aches, congestion x3 days.  T-max 102.  Treatment at home with Tylenol and NyQuil.  She denies rash, sore throat, difficulty breathing, vomiting, or other symptoms.  Her medical history includes hypertension, asthma, gastric ulcer, chronic sinusitis.  The history is provided by the patient and medical records.   Past Medical History:  Diagnosis Date   Allergy    Anxiety    Asthma    Colon cancer screening    Depression    Eczema    Fatigue    Frequent headaches    Gastric ulcer    GERD (gastroesophageal reflux disease)    Goiter    History of chicken pox    HPV in female    Hypertension    Insomnia    Leg pain    Snoring    UTI (urinary tract infection)     Patient Active Problem List   Diagnosis Date Noted   Viral upper respiratory tract infection 07/31/2020   Suspected COVID-19 virus infection 07/31/2020   Glaucoma 10/06/2019   Iron deficiency 10/06/2019   Chronic gastric ulcer without hemorrhage and without perforation 04/07/2019   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   Abdominal pain, chronic, epigastric    Insomnia 12/22/2017   Migraine 12/17/2017   Hot flashes 12/17/2017   Multiple thyroid nodules 12/17/2017   Environmental allergies 12/17/2017   Chronic sinusitis 12/17/2017   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   Obesity 01/15/2017   Tobacco abuse 01/15/2017   Morbid obesity with BMI of 40.0-44.9, adult ([place]) 07/18/2016   Anxiety 05/27/2016   Osteoarthritis of knee 05/27/2016   Essential hypertension 09/04/2015   Obesity with body mass index 30 or greater 08/26/2011    Past Surgical History:   Procedure Laterality Date   CESAREAN SECTION  2008   COLONOSCOPY WITH PROPOFOL N/A 02/08/2018   Procedure: COLONOSCOPY WITH PROPOFOL;  Surgeon: Lin Landsman, MD;  Location: [facility];  Service: Gastroenterology;  Laterality: N/A;   CYSTECTOMY     cyst on lip   ESOPHAGOGASTRODUODENOSCOPY (EGD) WITH PROPOFOL N/A 02/08/2018   Procedure: ESOPHAGOGASTRODUODENOSCOPY (EGD) WITH PROPOFOL;  Surgeon: Lin Landsman, MD;  Location: [place];  Service: Gastroenterology;  Laterality: N/A;   GASTRIC BYPASS     8/18   MASS EXCISION  06/23/2011   Procedure: EXCISION MASS;  Surgeon: Wynonia Sours, MD;  Location: [place];  Service: Orthopedics;  Laterality: Left;  left thumb and left index finger   MULTIPLE TOOTH EXTRACTIONS  2/12   ROTATOR CUFF REPAIR     right 09/2017 tear repair     OB History   No obstetric history on file.      Home Medications    Prior to Admission medications   Medication Sig Start Date End Date Taking? Authorizing Provider  ALPRAZolam Duanne Moron) 0.5 MG tablet Take 0.5 mg by mouth 2 (two) times daily as needed.  12/07/17  Yes [provider]  amLODipine (NORVASC) 5 MG tablet TAKE 1 TABLET BY MOUTH EVERY DAY 11/28/20  Yes McLean-Scocuzza, [name], MD  BRIMONIDINE TARTRATE-TIMOLOL OP Apply to eye.  Yes [provider]  diclofenac Sodium (VOLTAREN) 1 % GEL Apply 2 g topically 4 (four) times daily. 07/05/20 07/05/21 Yes [provider]  famotidine (PEPCID) 20 MG tablet Take by mouth.   Yes [provider]  FLUoxetine (PROZAC) 20 MG capsule  09/12/18  Yes [provider]  hydrochlorothiazide (HYDRODIURIL) 12.5 MG tablet Take 1 tablet (12.5 mg total) by mouth daily. In am 10/06/19  Yes McLean-Scocuzza, [name], MD  latanoprost (XALATAN) 0.005 % ophthalmic solution INSTILL 1 DROP(S) IN [facility] EYE ONCE DAILY AT BEDTIME 02/04/18  Yes [provider]  Multiple Vitamin (MULTI-VITAMINS) TABS Take by mouth.    Yes [provider]  omeprazole (PRILOSEC) 40 MG capsule TAKE 1 CAPSULE BY MOUTH EVERY DAY BEFORE BREAKFAST 05/01/20  Yes Vanga, Tally Due, MD  SUMAtriptan (IMITREX) 50 MG tablet May repeat in 2 hours if headache persists or recurs. Max 4 pills in 24 hours 10/06/19  Yes McLean-Scocuzza, [name], MD  timolol (TIMOPTIC) 0.5 % ophthalmic solution timolol maleate 0.5 % eye drops   Yes [provider]  amoxicillin-clavulanate (AUGMENTIN) 875-125 MG tablet Take 1 tablet by mouth 2 (two) times daily. 08/06/20   Flinchum, Kelby Aline, FNP  buPROPion (WELLBUTRIN SR) 100 MG 12 hr tablet bupropion HCl SR 100 mg tablet,12 hr sustained-release    [provider]  CALCIUM-VITAMIN D PO Take by mouth 2 (two) times daily.    [provider]  traMADol (ULTRAM) 50 MG tablet tramadol 50 mg tablet  TAKE 1 TABLET BY MOUTH EVERY 6 HOURS AS NEEDED    [provider]    Family History Family History  Problem Relation Age of Onset   Heart disease Father    Glaucoma Father    Thyroid disease Mother    Hypertension Mother    Alcohol abuse Mother    Depression Mother    Prostate cancer Maternal Grandfather    Asthma Son    Learning disabilities Son     Social History Social History   Tobacco Use   Smoking status: Former    Packs/day: 0.50    Types: Cigarettes    Start date: 09/22/2019   Smokeless tobacco: Never  Vaping Use   Vaping Use: Some days  Substance Use Topics   Alcohol use: Yes    Alcohol/week: 7.0 standard drinks    Types: 7 Standard drinks or equivalent per week    Comment: occasional wine drinker   Drug use: Not Currently     Allergies   Cyclinex [tetracycline hcl], Demeclocycline, Doxycycline, Minocycline, Tetracycline, Tetracyclines & related, and Other   Review of Systems Review of Systems  Constitutional:  Positive for chills, fatigue and fever.  HENT:  Positive for congestion. Negative for ear pain and sore throat.   Respiratory:   Negative for cough and shortness of breath.   Cardiovascular:  Negative for chest pain and palpitations.  Gastrointestinal:  Negative for abdominal pain and vomiting.  Skin:  Negative for color change and rash.  All other systems reviewed and are negative.   Physical Exam Triage Vital Signs ED Triage Vitals  Enc Vitals Group     BP      Pulse      Resp      Temp      Temp src      SpO2      Weight      Height      Head Circumference      Peak Flow  Pain Score      Pain Loc      Pain Edu?      Excl. in [place]?    No data found.  Updated Vital Signs BP 130/90 (BP Location: Left Arm)   Pulse 85   Temp (!) 100.6 F (38.1 C)   LMP 10/08/2016 (Exact Date) Comment: neg preg test 2/27  SpO2 96%   Visual Acuity Right Eye Distance:   Left Eye Distance:   Bilateral Distance:    Right Eye Near:   Left Eye Near:    Bilateral Near:     Physical Exam Vitals and nursing note reviewed.  Constitutional:      General: She is not in acute distress.    Appearance: She is well-developed.  HENT:     Head: Normocephalic and atraumatic.     Right Ear: Tympanic membrane normal.     Left Ear: Tympanic membrane normal.     Nose: Nose normal.     Mouth/Throat:     Mouth: Mucous membranes are moist.     Pharynx: Oropharynx is clear.  Eyes:     Conjunctiva/sclera: Conjunctivae normal.  Cardiovascular:     Rate and Rhythm: Normal rate and regular rhythm.     Heart sounds: Normal heart sounds.  Pulmonary:     Effort: Pulmonary effort is normal. No respiratory distress.     Breath sounds: Normal breath sounds.  Abdominal:     Palpations: Abdomen is soft.     Tenderness: There is no abdominal tenderness.  Musculoskeletal:     Cervical back: Neck supple.  Skin:    General: Skin is warm and dry.  Neurological:     General: No focal deficit present.     Mental Status: She is alert and oriented to person, place, and time.     Gait: Gait normal.  Psychiatric:        Mood and  Affect: Mood normal.        Behavior: Behavior normal.     UC Treatments / Results  Labs (all labs ordered are listed, but only abnormal results are displayed) Labs Reviewed  COVID-19, FLU A+B NAA    EKG   Radiology No results found.  Procedures Procedures (including critical care time)  Medications Ordered in UC Medications  acetaminophen (TYLENOL) tablet 650 mg (650 mg Oral Given 12/21/20 1152)    Initial Impression / Assessment and Plan / UC Course  I have reviewed the triage vital signs and the nursing notes.  Pertinent labs & imaging results that were available during my care of the patient were reviewed by me and considered in my medical decision making (see chart for details).   Viral illness.  COVID and Flu pending.  Instructed patient to self quarantine per CDC guidelines.  Discussed symptomatic treatment including Tylenol, rest, hydration.  Instructed patient to follow up with PCP if symptoms are not improving.  Patient agrees to plan of care.    Final Clinical Impressions(s) / UC Diagnoses   Final diagnoses:  Viral illness     Discharge Instructions      Your COVID and Flu tests are pending.  You should self quarantine until the test results are back.    Take Tylenol or ibuprofen as needed for fever or discomfort.  Rest and keep yourself hydrated.    Follow-up with your primary care provider if your symptoms are not improving.         ED Prescriptions   None  PDMP not reviewed this encounter.   Sharion Balloon, NP 12/21/20 1246

## 2020-12-21 NOTE — ED Triage Notes (Signed)
Pt c/o sinus pressure, chills, congestion, and fatigue, bodyaches x 3 days

## 2020-12-21 NOTE — Discharge Instructions (Addendum)
Your COVID and Flu tests are pending.  You should self quarantine until the test results are back.    Take Tylenol or ibuprofen as needed for fever or discomfort.  Rest and keep yourself hydrated.    Follow-up with your primary care provider if your symptoms are not improving.     

## 2020-12-23 LAB — COVID-19, FLU A+B NAA
Influenza A, NAA: NOT DETECTED
Influenza B, NAA: NOT DETECTED
SARS-CoV-2, NAA: DETECTED — AB

## 2020-12-31 ENCOUNTER — Other Ambulatory Visit: Payer: Self-pay | Admitting: Internal Medicine

## 2020-12-31 ENCOUNTER — Other Ambulatory Visit: Payer: Self-pay

## 2020-12-31 DIAGNOSIS — G43011 Migraine without aura, intractable, with status migrainosus: Secondary | ICD-10-CM

## 2020-12-31 MED ORDER — SUMATRIPTAN SUCCINATE 50 MG PO TABS: ORAL_TABLET | ORAL | 2 refills | Status: DC

## 2021-01-03 ENCOUNTER — Telehealth (INDEPENDENT_AMBULATORY_CARE_PROVIDER_SITE_OTHER): Payer: BC Managed Care – PPO | Admitting: Family Medicine

## 2021-01-03 ENCOUNTER — Other Ambulatory Visit: Payer: Self-pay

## 2021-01-03 ENCOUNTER — Encounter: Payer: Self-pay | Admitting: Family Medicine

## 2021-01-03 VITALS — Wt 194.0 lb

## 2021-01-03 DIAGNOSIS — R058 Other specified cough: Secondary | ICD-10-CM | POA: Diagnosis not present

## 2021-01-03 DIAGNOSIS — R0981 Nasal congestion: Secondary | ICD-10-CM | POA: Diagnosis not present

## 2021-01-03 MED ORDER — FLUTICASONE PROPIONATE 50 MCG/ACT NA SUSP
2.0000 | Freq: Every day | NASAL | 0 refills | Status: DC
Start: 2021-01-03 — End: 2021-02-15

## 2021-01-03 NOTE — Progress Notes (Signed)
I connected with Adrienne Savage on 01/03/21 at 12:00 PM EDT by video and verified that I am speaking with the correct person using two identifiers.   I discussed the limitations, risks, security and privacy concerns of performing an evaluation and management service by video and the availability of in person appointments. I also discussed with the patient that there may be a patient responsible charge related to this service. The patient expressed understanding and agreed to proceed.  Patient location: parked car Provider Location: [place] Participants: Lesleigh Noe and Adrienne Savage   Subjective:     Adrienne Savage is a 50 y.o. female presenting for Covid Positive (Tested 12/14/20), Headache (Off and on ), Nasal Congestion, Cough (wet), and Fatigue     Headache  Associated symptoms include coughing. Pertinent negatives include no fever, nausea, sinus pressure, sore throat or vomiting.  Cough Associated symptoms include headaches. Pertinent negatives include no chills, fever or sore throat.   #Covid 19 infection - initially got sick on 9/14 - tested on 9/16 and results on 9/18 - no longer having fevers - still having some cough - and congestion - mostly in the head, occasionally feels it is in the chest - by the 4th call at her job - is having to cough to clear the throat - treatment: decongestant - w/ some improvement - migraine treatment is helping with the headache    Review of Systems  Constitutional:  Negative for chills and fever.  HENT:  Positive for congestion. Negative for sinus pressure, sinus pain and sore throat.   Respiratory:  Positive for cough.   Gastrointestinal:  Negative for diarrhea, nausea and vomiting.  Neurological:  Positive for headaches.    Social History   Tobacco Use  Smoking Status Former   Packs/day: 0.50   Types: Cigarettes   Start date: 09/22/2019  Smokeless Tobacco Never        Objective:   BP  Readings from Last 3 Encounters:  12/21/20 130/90  05/01/20 123/85  02/15/20 (!) 139/97   Wt Readings from Last 3 Encounters:  01/03/21 194 lb (88 kg)  07/31/20 194 lb (88 kg)  05/01/20 199 lb 4 oz (90.4 kg)    Wt 194 lb (88 kg)   LMP 10/08/2016 (Exact Date) Comment: neg preg test 2/27  BMI 33.28 kg/m   Physical Exam Constitutional:      Appearance: Normal appearance. She is not ill-appearing.  HENT:     Head: Normocephalic and atraumatic.     Right Ear: External ear normal.     Left Ear: External ear normal.  Eyes:     Conjunctiva/sclera: Conjunctivae normal.  Pulmonary:     Effort: Pulmonary effort is normal. No respiratory distress.  Neurological:     Mental Status: She is alert. Mental status is at baseline.  Psychiatric:        Mood and Affect: Mood normal.        Behavior: Behavior normal.        Thought Content: Thought content normal.        Judgment: Judgment normal.           Assessment & Plan:   Problem List Items Addressed This Visit   None Visit Diagnoses     Sinus congestion    -  Primary   Relevant Medications   fluticasone (FLONASE) 50 MCG/ACT nasal spray   Post-viral cough syndrome  Initially offered course of antibiotics but as pt notes HA improved with migraine treatment and less concerned about infection elected to not do antibiotics.   Discussed time course for recovery and to return if cough not improved by 6-8 weeks for in-person evaluation.   Flonase for symptoms.    Return if symptoms worsen or fail to improve.  Lesleigh Noe, MD

## 2021-01-09 ENCOUNTER — Encounter: Payer: Self-pay | Admitting: Family Medicine

## 2021-01-09 DIAGNOSIS — J0140 Acute pansinusitis, unspecified: Secondary | ICD-10-CM

## 2021-01-09 DIAGNOSIS — B379 Candidiasis, unspecified: Secondary | ICD-10-CM

## 2021-01-09 DIAGNOSIS — T3695XA Adverse effect of unspecified systemic antibiotic, initial encounter: Secondary | ICD-10-CM

## 2021-01-10 MED ORDER — AMOXICILLIN-POT CLAVULANATE 875-125 MG PO TABS: 1.0000 | ORAL_TABLET | Freq: Two times a day (BID) | ORAL | 0 refills | Status: AC

## 2021-01-10 MED ORDER — FLUCONAZOLE 150 MG PO TABS
150.0000 mg | ORAL_TABLET | Freq: Once | ORAL | 0 refills | Status: AC
Start: 2021-01-10 — End: 2021-01-10

## 2021-02-01 ENCOUNTER — Other Ambulatory Visit: Payer: Self-pay | Admitting: Family Medicine

## 2021-02-01 DIAGNOSIS — R0981 Nasal congestion: Secondary | ICD-10-CM

## 2021-02-06 DIAGNOSIS — F419 Anxiety disorder, unspecified: Secondary | ICD-10-CM | POA: Diagnosis not present

## 2021-02-06 DIAGNOSIS — F329 Major depressive disorder, single episode, unspecified: Secondary | ICD-10-CM | POA: Diagnosis not present

## 2021-02-15 NOTE — Telephone Encounter (Signed)
Filled by Dr Einar Pheasant for cold symptoms. Okay to send in refill?

## 2021-03-04 ENCOUNTER — Encounter: Payer: Self-pay | Admitting: Internal Medicine

## 2021-04-03 DIAGNOSIS — F329 Major depressive disorder, single episode, unspecified: Secondary | ICD-10-CM | POA: Diagnosis not present

## 2021-04-03 DIAGNOSIS — F419 Anxiety disorder, unspecified: Secondary | ICD-10-CM | POA: Diagnosis not present

## 2021-04-10 ENCOUNTER — Ambulatory Visit: Payer: BC Managed Care – PPO | Admitting: Internal Medicine

## 2021-04-10 ENCOUNTER — Encounter: Payer: Self-pay | Admitting: Internal Medicine

## 2021-04-10 ENCOUNTER — Other Ambulatory Visit: Payer: Self-pay

## 2021-04-10 VITALS — BP 138/88 | HR 84 | Temp 96.8°F | Ht 64.02 in | Wt 203.0 lb

## 2021-04-10 DIAGNOSIS — R634 Abnormal weight loss: Secondary | ICD-10-CM

## 2021-04-10 DIAGNOSIS — E559 Vitamin D deficiency, unspecified: Secondary | ICD-10-CM

## 2021-04-10 DIAGNOSIS — I10 Essential (primary) hypertension: Secondary | ICD-10-CM

## 2021-04-10 DIAGNOSIS — E669 Obesity, unspecified: Secondary | ICD-10-CM

## 2021-04-10 DIAGNOSIS — E538 Deficiency of other specified B group vitamins: Secondary | ICD-10-CM

## 2021-04-10 DIAGNOSIS — Z1389 Encounter for screening for other disorder: Secondary | ICD-10-CM | POA: Diagnosis not present

## 2021-04-10 DIAGNOSIS — E042 Nontoxic multinodular goiter: Secondary | ICD-10-CM

## 2021-04-10 DIAGNOSIS — R739 Hyperglycemia, unspecified: Secondary | ICD-10-CM | POA: Diagnosis not present

## 2021-04-10 DIAGNOSIS — E611 Iron deficiency: Secondary | ICD-10-CM | POA: Diagnosis not present

## 2021-04-10 DIAGNOSIS — Z9884 Bariatric surgery status: Secondary | ICD-10-CM

## 2021-04-10 LAB — IBC + FERRITIN
Ferritin: 24.4 ng/mL (ref 10.0–291.0)
Iron: 110 ug/dL (ref 42–145)
Saturation Ratios: 25.3 % (ref 20.0–50.0)
TIBC: 435.4 ug/dL (ref 250.0–450.0)
Transferrin: 311 mg/dL (ref 212.0–360.0)

## 2021-04-10 LAB — COMPREHENSIVE METABOLIC PANEL
ALT: 9 U/L (ref 0–35)
AST: 13 U/L (ref 0–37)
Albumin: 4.1 g/dL (ref 3.5–5.2)
Alkaline Phosphatase: 92 U/L (ref 39–117)
BUN: 9 mg/dL (ref 6–23)
CO2: 31 mEq/L (ref 19–32)
Calcium: 8.8 mg/dL (ref 8.4–10.5)
Chloride: 104 mEq/L (ref 96–112)
Creatinine, Ser: 0.92 mg/dL (ref 0.40–1.20)
GFR: 71.95 mL/min (ref >60.00)
Glucose, Bld: 88 mg/dL (ref 70–99)
Potassium: 4.5 mEq/L (ref 3.5–5.1)
Sodium: 140 mEq/L (ref 135–145)
Total Bilirubin: 0.5 mg/dL (ref 0.2–1.2)
Total Protein: 6.9 g/dL (ref 6.0–8.3)

## 2021-04-10 LAB — LIPID PANEL
Cholesterol: 174 mg/dL (ref 0–200)
HDL: 55.4 mg/dL (ref >39.00)
LDL Cholesterol: 100 mg/dL — ABNORMAL HIGH (ref 0–99)
NonHDL: 119.09
Total CHOL/HDL Ratio: 3
Triglycerides: 96 mg/dL (ref 0.0–149.0)
VLDL: 19.2 mg/dL (ref 0.0–40.0)

## 2021-04-10 LAB — TSH: TSH: 0.79 u[IU]/mL (ref 0.35–5.50)

## 2021-04-10 LAB — VITAMIN D 25 HYDROXY (VIT D DEFICIENCY, FRACTURES): VITD: 35.63 ng/mL (ref 30.00–100.00)

## 2021-04-10 LAB — VITAMIN B12: Vitamin B-12: 681 pg/mL (ref 211–911)

## 2021-04-10 LAB — HEMOGLOBIN A1C: Hgb A1c MFr Bld: 5.3 % (ref 4.6–6.5)

## 2021-04-10 MED ORDER — WEGOVY 0.25 MG/0.5ML ~~LOC~~ SOAJ: 0.2500 mg | SUBCUTANEOUS | 0 refills | Status: DC

## 2021-04-10 MED ORDER — WEGOVY 2.4 MG/0.75ML ~~LOC~~ SOAJ: 2.4000 mg | SUBCUTANEOUS | 0 refills | Status: DC

## 2021-04-10 MED ORDER — HYDROCHLOROTHIAZIDE 12.5 MG PO TABS: 12.5000 mg | ORAL_TABLET | Freq: Every day | ORAL | 3 refills | Status: DC

## 2021-04-10 MED ORDER — WEGOVY 1 MG/0.5ML ~~LOC~~ SOAJ: 1.0000 mg | SUBCUTANEOUS | 0 refills | Status: DC

## 2021-04-10 MED ORDER — WEGOVY 0.5 MG/0.5ML ~~LOC~~ SOAJ
0.5000 mg | SUBCUTANEOUS | 0 refills | Status: DC
Start: 2021-04-10 — End: 2021-07-09

## 2021-04-10 MED ORDER — AMLODIPINE BESYLATE 5 MG PO TABS: 5.0000 mg | ORAL_TABLET | Freq: Every day | ORAL | 3 refills | Status: DC

## 2021-04-10 MED ORDER — WEGOVY 1.7 MG/0.75ML ~~LOC~~ SOAJ
1.7000 mg | SUBCUTANEOUS | 0 refills | Status: DC
Start: 2021-04-10 — End: 2021-07-09

## 2021-04-10 NOTE — Progress Notes (Signed)
Chief Complaint  Patient presents with   Follow-up   Weight loss  1. Had bypass at [facility] but no longer losing weight  Qysimia contraindicated due to glaucoma disc wegovy or saxenda 2. Htn no meds today on norvasc 5 and hctz 12.5 mg qd  3 still smoking    Review of Systems  Constitutional:  Negative for weight loss.  HENT:  Negative for hearing loss.   Eyes:  Negative for blurred vision.  Respiratory:  Negative for shortness of breath.   Cardiovascular:  Negative for chest pain.  Gastrointestinal:  Negative for abdominal pain and blood in stool.  Genitourinary:  Negative for dysuria.  Musculoskeletal:  Negative for back pain, falls and joint pain.  Skin:  Negative for rash.  Neurological:  Negative for headaches.  Psychiatric/Behavioral:  Negative for depression.   Past Medical History:  Diagnosis Date   Allergy    Anxiety    Arthritis of right knee    with bursitis 2022 Dr. Harlow Mares did PT   Asthma    Colon cancer screening    COVID-19    10 or 01/2021   Depression    Eczema    Fatigue    Frequent headaches    Gastric ulcer    GERD (gastroesophageal reflux disease)    Goiter    History of chicken pox    HPV in female    Hypertension    Influenza    fall winter 2022   Insomnia    Leg pain    Snoring    UTI (urinary tract infection)    Past Surgical History:  Procedure Laterality Date   CESAREAN SECTION  2008   COLONOSCOPY WITH PROPOFOL N/A 02/08/2018   Procedure: COLONOSCOPY WITH PROPOFOL;  Surgeon: Lin Landsman, MD;  Location: [place];  Service: Gastroenterology;  Laterality: N/A;   CYSTECTOMY     cyst on lip   ESOPHAGOGASTRODUODENOSCOPY (EGD) WITH PROPOFOL N/A 02/08/2018   Procedure: ESOPHAGOGASTRODUODENOSCOPY (EGD) WITH PROPOFOL;  Surgeon: Lin Landsman, MD;  Location: [place];  Service: Gastroenterology;  Laterality: N/A;   GASTRIC BYPASS     8/18   MASS EXCISION  06/23/2011   Procedure: EXCISION MASS;  Surgeon: Wynonia Sours, MD;   Location: [place];  Service: Orthopedics;  Laterality: Left;  left thumb and left index finger   MULTIPLE TOOTH EXTRACTIONS  2/12   ROTATOR CUFF REPAIR     right 09/2017 tear repair    Family History  Problem Relation Age of Onset   Heart disease Father    Glaucoma Father    Thyroid disease Mother    Hypertension Mother    Alcohol abuse Mother    Depression Mother    Prostate cancer Maternal Grandfather    Asthma Son    Learning disabilities Son    Social History   Socioeconomic History   Marital status: Married    Spouse name: Not on file   Number of children: 1    Years of education: BA   Highest education level: Not on file  Occupational History   Occupation: Engineer, structural   Tobacco Use   Smoking status: Former    Packs/day: 0.50    Types: Cigarettes    Start date: 09/22/2019   Smokeless tobacco: Never  Vaping Use   Vaping Use: Some days  Substance and Sexual Activity   Alcohol use: Yes    Alcohol/week: 7.0 standard drinks    Types: 7 Standard drinks or equivalent per  week    Comment: occasional wine drinker   Drug use: Not Currently   Sexual activity: Yes    Birth control/protection: None  Other Topics Concern   Not on file  Social History Narrative   Drinks about 1 cup of coffee a day, occasionally drinks a soda.    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    amLODipine (NORVASC) 5 MG tablet TAKE 1 TABLET BY MOUTH EVERY DAY   CALCIUM-VITAMIN D PO Take by mouth 2 (two) times daily.    FLUoxetine (PROZAC) 20 MG capsule    hydrochlorothiazide (HYDRODIURIL) 12.5 MG tablet Take 1 tablet (12.5 mg total) by mouth daily. In am   latanoprost (XALATAN) 0.005 % ophthalmic solution INSTILL 1 DROP(S) IN EACH EYE ONCE DAILY AT BEDTIME   Multiple Vitamin (MULTI-VITAMINS) TABS Take by mouth.   omeprazole (PRILOSEC) 40 MG capsule TAKE 1 CAPSULE BY MOUTH EVERY DAY BEFORE BREAKFAST   PREMPRO 0.3-1.5 MG tablet Take 1 tablet by mouth daily.   Semaglutide-Weight Management (WEGOVY) 0.25 MG/0.5ML SOAJ Inject 0.25 mg into the skin once a week.   Semaglutide-Weight Management (WEGOVY) 0.5 MG/0.5ML SOAJ Inject 0.5 mg into the skin once a week. Month 2   Semaglutide-Weight Management (WEGOVY) 1 MG/0.5ML SOAJ Inject 1 mg into the skin once a week. Month 3   Semaglutide-Weight Management (WEGOVY) 1.7 MG/0.75ML SOAJ Inject 1.7 mg into the skin once a week. Month 4   Semaglutide-Weight Management (WEGOVY) 2.4 MG/0.75ML SOAJ Inject 2.4 mg into the skin once a week. Month 5   SUMAtriptan (IMITREX) 50 MG tablet May repeat in 2 hours if headache persists or recurs. Max 4 pills in 24 hours   timolol (TIMOPTIC) 0.5 % ophthalmic solution timolol maleate 0.5 % eye drops   Current Facility-Administered Medications for the 04/10/21 encounter (Office Visit) with McLean-Scocuzza, [name], MD  Medication   betamethasone acetate-betamethasone sodium phosphate (CELESTONE) injection 3 mg   Allergies  Allergen Reactions   Cyclinex [Tetracycline Hcl] Shortness Of Breath   Demeclocycline Shortness Of Breath   Doxycycline Shortness Of Breath   Minocycline Shortness Of Breath   Tetracycline Shortness Of Breath   Tetracyclines & Related Shortness Of Breath   Other Diarrhea, Hives, Nausea Only, Other (See Comments) and Rash    CYCLINES   No results found for this or any previous visit (from the past 2160 hour(s)). Objective  Body mass index is 34.82 kg/m. Wt Readings from Last 3 Encounters:  04/10/21 203 lb (92.1  kg)  01/03/21 194 lb (88 kg)  07/31/20 194 lb (88 kg)   Temp Readings from Last 3 Encounters:  04/10/21 (!) 96.8 F (36 C) (Temporal)  12/21/20 (!) 100.6 F (38.1 C)  05/01/20 98.5 F (36.9 C) (Oral)   BP Readings from Last 3 Encounters:  04/10/21 138/88  12/21/20 130/90  05/01/20 123/85   Pulse Readings from Last 3 Encounters:  04/10/21 84  12/21/20 85  05/01/20 78    Physical Exam Vitals and nursing note reviewed.  Constitutional:      Appearance: Normal appearance. She is well-developed and well-groomed.  HENT:     Head: Normocephalic and atraumatic.  Eyes:     Conjunctiva/sclera: Conjunctivae normal.     Pupils: Pupils are equal, round, and reactive to light.  Cardiovascular:     Rate and Rhythm: Normal rate and regular rhythm.     Heart sounds: Normal heart sounds. No murmur heard. Pulmonary:     Effort: Pulmonary effort is normal.     Breath sounds: Normal breath sounds.  Abdominal:     General: Abdomen is flat. Bowel sounds are normal.     Tenderness: There is no abdominal tenderness.  Musculoskeletal:        General: No tenderness.  Skin:    General: Skin is warm and dry.  Neurological:     General: No focal deficit present.     Mental Status: She is alert and oriented to person, place, and time. Mental status is at baseline.     Cranial Nerves: Cranial nerves 2-12 are intact.     Gait: Gait is intact.  Psychiatric:        Attention and Perception: Attention and perception normal.        Mood and Affect: Mood and affect normal.        Speech: Speech normal.        Behavior: Behavior normal. Behavior is cooperative.        Thought Content: Thought content normal.        Cognition and Memory: Cognition and memory normal.        Judgment: Judgment normal.    Assessment  Plan  Weight loss Rx wegovy  Or saxenda   Essential hypertension - Plan: Comprehensive metabolic panel, Lipid panel Norvasc 5 mg qd hctz 12.5 mg qd no meds today  Multiple  thyroid nodules - Plan: TSH  Iron deficiency - Plan: IBC + Ferritin  H/O gastric bypass - Plan: Semaglutide-Weight Management (WEGOVY) 0.25 MG/0.5ML SOAJ, Semaglutide-Weight Management (WEGOVY) 0.5 MG/0.5ML SOAJ, Semaglutide-Weight Management (WEGOVY) 1 MG/0.5ML SOAJ, Semaglutide-Weight Management (WEGOVY) 2.4 MG/0.75ML SOAJ, Semaglutide-Weight Management (WEGOVY) 1.7 MG/0.75ML SOAJ Disc blue sky   Obesity (BMI 30-39.9) - Plan: Semaglutide-Weight Management (WEGOVY) 0.25 MG/0.5ML SOAJ, Semaglutide-Weight Management (WEGOVY) 0.5 MG/0.5ML SOAJ, Semaglutide-Weight Management (WEGOVY) 1 MG/0.5ML SOAJ, Semaglutide-Weight Management (WEGOVY) 2.4 MG/0.75ML SOAJ, Semaglutide-Weight Management (WEGOVY) 1.7 MG/0.75ML SOAJ  Reduce carbs exercise as able with right knee issues   HM Flu shot utd 2020  Tdap utd  Declines covid 19  Declines HIV check  Declines MMR not immune  covid 2/2    Hep C neg 07/05/20   Pap records Dr. Raphael Gibney [facility] [place] h/o HPV/abnormal pap  -09/11/17 neg pap no HPV  ROI today 04/10/21     Mammogram OB/GYN had 09/11/17 [facility] negative  -encouraged pt 04/07/19 to call ob/gyn to get mammogram   sch 10/10/19 12 pm    Colonoscopy   02/08/18 Dr. Marius Ditch due again in 2029    Ortho Dr. Malena Catholic ortho  Dr. Derrill Kay [facility] Bariatric surgery    [facility] wears glasses FH glaucoma being monitored for elevated pressure in her eye not yet dx'ed glaucoma    Obtained records ob/GYN above [facility] cerv[facility]x 04/1999  Colp 03/06/98-09/20/97  H/o HSV Former smoker quit 11/2018 congrats  rec healthy diet and exercise    Provider: Dr. [name] McLean-Scocuzza-Internal Medicine

## 2021-04-10 NOTE — Patient Instructions (Signed)
Liraglutide Injection (Weight Management) What is this medication? LIRAGLUTIDE (LIR a GLOO tide) promotes weight loss. It may also be used to maintain weight loss. It works by decreasing appetite. Changes to diet and exercise are often combined with this medication. This medicine may be used for other purposes; ask your health care provider or pharmacist if you have questions. COMMON BRAND NAME(S): Saxenda What should I tell my care team before I take this medication? They need to know if you have any of these conditions: Endocrine tumors (MEN 2) or if someone in your family had these tumors Gallbladder disease High cholesterol History of alcohol abuse problem History of pancreatitis Kidney disease or if you are on dialysis Liver disease Previous swelling of the tongue, face, or lips with difficulty breathing, difficulty swallowing, hoarseness, or tightening of the throat Stomach problems Suicidal thoughts, plans, or attempt; a previous suicide attempt by you or a family member Thyroid cancer or if someone in your family had thyroid cancer An unusual or allergic reaction to liraglutide, other medications, foods, dyes, or preservatives Pregnant or trying to get pregnant Breast-feeding How should I use this medication? This medication is for injection under the skin of your upper leg, stomach area, or upper arm. You will be taught how to prepare and give this medication. Use exactly as directed. Take your medication at regular intervals. Do not take it more often than directed. This medication comes with INSTRUCTIONS FOR USE. Ask your pharmacist for directions on how to use this medication. Read the information carefully. Talk to your pharmacist or care team if you have questions. It is important that you put your used needles and syringes in a special sharps container. Do not put them in a trash can. If you do not have a sharps container, call your pharmacist or care team to get one. A  special MedGuide will be given to you by the pharmacist with each prescription and refill. Be sure to read this information carefully each time. Talk to your care team about the use of this medication in children. While it may be prescribed for children as young as 59 years of age for selected conditions, precautions do apply. Overdosage: If you think you have taken too much of this medicine contact a poison control center or emergency room at once. NOTE: This medicine is only for you. Do not share this medicine with others. What if I miss a dose? If you miss a dose, take it as soon as you can. If it is almost time for your next dose, take only that dose. Do not take double or extra doses. If you miss your dose for 3 days or more, call your care team to talk about how to restart this medicine. What may interact with this medication? Insulin and other medications for diabetes This list may not describe all possible interactions. Give your health care provider a list of all the medicines, herbs, non-prescription drugs, or dietary supplements you use. Also tell them if you smoke, drink alcohol, or use illegal drugs. Some items may interact with your medicine. What should I watch for while using this medication? Visit your care team for regular checks on your progress. Drink plenty of fluids while taking this medication. Check with your care team if you get an attack of severe diarrhea, nausea, and vomiting. The loss of too much body fluid can make it dangerous for you to take this medication. This medication may affect blood sugar levels. Ask your care team if  changes in diet or medications are needed if you have diabetes. Patients and their families should watch out for worsening depression or thoughts of suicide. Also watch out for sudden changes in feelings such as feeling anxious, agitated, panicky, irritable, hostile, aggressive, impulsive, severely restless, overly excited and hyperactive, or not  being able to sleep. If this happens, especially at the beginning of treatment or after a change in dose, call your care team. Women should inform their care team if they wish to become pregnant or think they might be pregnant. Losing weight while pregnant is not advised and may cause harm to the unborn child. Talk to your care team for more information. What side effects may I notice from receiving this medication? Side effects that you should report to your care team as soon as possible: Allergic reactions or angioedema--skin rash, itching, hives, swelling of the face, eyes, lips, tongue, arms, or legs, trouble swallowing or breathing Fast or irregular heartbeat Gallbladder problems--severe stomach pain, nausea, vomiting, fever Kidney injury--decrease in the amount of urine, swelling of the ankles, hands, or feet Pancreatitis--severe stomach pain that spreads to your back or gets worse after eating or when touched, fever, nausea, vomiting Thoughts of suicide or self-harm, worsening mood, feelings of depression Thyroid cancer--new mass or lump in the neck, pain or trouble swallowing, trouble breathing, hoarseness Side effects that usually do not require medical attention (report to your care team if they continue or are bothersome): Constipation Dizziness Fatigue Headache Loss of Appetite Nausea Upset stomach This list may not describe all possible side effects. Call your doctor for medical advice about side effects. You may report side effects to FDA at 1-800-FDA-1088. Where should I keep my medication? Keep out of the reach of children and pets. Store unopened pen in a refrigerator between 2 and 8 degrees C (36 and 46 degrees F). Do not freeze or use if the medication has been frozen. Protect from light and excessive heat. After you first use the pen, it can be stored at room temperature between 15 and 30 degrees C (59 and 86 degrees F) or in a refrigerator. Throw away your used pen after 30  days or after the expiration date, whichever comes first. Do not store your pen with the needle attached. If the needle is left on, medication may leak from the pen. NOTE: This sheet is a summary. It may not cover all possible information. If you have questions about this medicine, talk to your doctor, pharmacist, or health care provider.  2022 Elsevier/Gold Standard (2020-04-20 00:00:00)  Semaglutide Injection (Weight Management) What is this medication? SEMAGLUTIDE (SEM a GLOO tide) promotes weight loss. It may also be used to maintain weight loss. It works by decreasing appetite. Changes to diet and exercise are often combined with this medication. This medicine may be used for other purposes; ask your health care provider or pharmacist if you have questions. COMMON BRAND NAME(S): VOJJKK What should I tell my care team before I take this medication? They need to know if you have any of these conditions: Endocrine tumors (MEN 2) or if someone in your family had these tumors Eye disease, vision problems Gallbladder disease History of depression or mental health disease History of pancreatitis Kidney disease Stomach or intestine problems Suicidal thoughts, plans, or attempt; a previous suicide attempt by you or a family member Thyroid cancer or if someone in your family had thyroid cancer An unusual or allergic reaction to semaglutide, other medications, foods, dyes, or preservatives Pregnant  or trying to get pregnant Breast-feeding How should I use this medication? This medication is injected under the skin. You will be taught how to prepare and give it. Take it as directed on the prescription label. It is given once every week (every 7 days). Keep taking it unless your care team tells you to stop. It is important that you put your used needles and pens in a special sharps container. Do not put them in a trash can. If you do not have a sharps container, call your pharmacist or care team  to get one. A special MedGuide will be given to you by the pharmacist with each prescription and refill. Be sure to read this information carefully each time. This medication comes with INSTRUCTIONS FOR USE. Ask your pharmacist for directions on how to use this medication. Read the information carefully. Talk to your pharmacist or care team if you have questions. Talk to your care team about the use of this medication in children. Special care may be needed. Overdosage: If you think you have taken too much of this medicine contact a poison control center or emergency room at once. NOTE: This medicine is only for you. Do not share this medicine with others. What if I miss a dose? If you miss a dose and the next scheduled dose is more than 2 days away, take the missed dose as soon as possible. If you miss a dose and the next scheduled dose is less than 2 days away, do not take the missed dose. Take the next dose at your regular time. Do not take double or extra doses. If you miss your dose for 2 weeks or more, take the next dose at your regular time or call your care team to talk about how to restart this medication. What may interact with this medication? Insulin and other medications for diabetes This list may not describe all possible interactions. Give your health care provider a list of all the medicines, herbs, non-prescription drugs, or dietary supplements you use. Also tell them if you smoke, drink alcohol, or use illegal drugs. Some items may interact with your medicine. What should I watch for while using this medication? Visit your care team for regular checks on your progress. It may be some time before you see the benefit from this medication. Drink plenty of fluids while taking this medication. Check with your care team if you have severe diarrhea, nausea, and vomiting, or if you sweat a lot. The loss of too much body fluid may make it dangerous for you to take this medication. This  medication may affect blood sugar levels. Ask your care team if changes in diet or medications are needed if you have diabetes. If you or your family notice any changes in your behavior, such as new or worsening depression, thoughts of harming yourself, anxiety, other unusual or disturbing thoughts, or memory loss, call your care team right away. Women should inform their care team if they wish to become pregnant or think they might be pregnant. Losing weight while pregnant is not advised and may cause harm to the unborn child. Talk to your care team for more information. What side effects may I notice from receiving this medication? Side effects that you should report to your care team as soon as possible: Allergic reactions--skin rash, itching, hives, swelling of the face, lips, tongue, or throat Change in vision Dehydration--increased thirst, dry mouth, feeling faint or lightheaded, headache, dark yellow or brown urine Gallbladder problems--severe  stomach pain, nausea, vomiting, fever Heart palpitations--rapid, pounding, or irregular heartbeat Kidney injury--decrease in the amount of urine, swelling of the ankles, hands, or feet Pancreatitis--severe stomach pain that spreads to your back or gets worse after eating or when touched, fever, nausea, vomiting Thoughts of suicide or self-harm, worsening mood, feelings of depression Thyroid cancer--new mass or lump in the neck, pain or trouble swallowing, trouble breathing, hoarseness Side effects that usually do not require medical attention (report to your care team if they continue or are bothersome): Diarrhea Loss of appetite Nausea Stomach pain Vomiting This list may not describe all possible side effects. Call your doctor for medical advice about side effects. You may report side effects to FDA at 1-800-FDA-1088. Where should I keep my medication? Keep out of the reach of children and pets. Refrigeration (preferred): Store in the  refrigerator. Do not freeze. Keep this medication in the original container until you are ready to take it. Get rid of any unused medication after the expiration date. Room temperature: If needed, prior to cap removal, the pen can be stored at room temperature for up to 28 days. Protect from light. If it is stored at room temperature, get rid of any unused medication after 28 days or after it expires, whichever is first. It is important to get rid of the medication as soon as you no longer need it or it is expired. You can do this in two ways: Take the medication to a medication take-back program. Check with your pharmacy or law enforcement to find a location. If you cannot return the medication, follow the directions in the [place]. NOTE: This sheet is a summary. It may not cover all possible information. If you have questions about this medicine, talk to your doctor, pharmacist, or health care provider.  2022 Elsevier/Gold Standard (2020-06-22 00:00:00)

## 2021-04-11 LAB — URINALYSIS W MICROSCOPIC + REFLEX CULTURE
Bacteria, UA: NONE SEEN /HPF
Bilirubin Urine: NEGATIVE
Glucose, UA: NEGATIVE
Hgb urine dipstick: NEGATIVE
Hyaline Cast: NONE SEEN /LPF
Ketones, ur: NEGATIVE
Leukocyte Esterase: NEGATIVE
Nitrites, Initial: NEGATIVE
Protein, ur: NEGATIVE
RBC / HPF: NONE SEEN /HPF (ref 0–2)
Specific Gravity, Urine: 1.017 (ref 1.001–1.035)
WBC, UA: NONE SEEN /HPF (ref 0–5)
pH: 7 (ref 5.0–8.0)

## 2021-04-11 LAB — NO CULTURE INDICATED

## 2021-04-16 ENCOUNTER — Encounter: Payer: Self-pay | Admitting: Internal Medicine

## 2021-04-17 ENCOUNTER — Other Ambulatory Visit: Payer: Self-pay

## 2021-04-17 ENCOUNTER — Emergency Department
Admission: EM | Admit: 2021-04-17 | Discharge: 2021-04-17 | Disposition: A | Discharge status: Home / Self Care | Payer: BC Managed Care – PPO | Attending: Emergency Medicine | Admitting: Emergency Medicine

## 2021-04-17 DIAGNOSIS — R9431 Abnormal electrocardiogram [ECG] [EKG]: Secondary | ICD-10-CM | POA: Diagnosis not present

## 2021-04-17 DIAGNOSIS — R4020 Unspecified coma: Secondary | ICD-10-CM | POA: Diagnosis not present

## 2021-04-17 DIAGNOSIS — T887XXA Unspecified adverse effect of drug or medicament, initial encounter: Secondary | ICD-10-CM | POA: Diagnosis not present

## 2021-04-17 DIAGNOSIS — T50904A Poisoning by unspecified drugs, medicaments and biological substances, undetermined, initial encounter: Secondary | ICD-10-CM | POA: Diagnosis not present

## 2021-04-17 DIAGNOSIS — R404 Transient alteration of awareness: Secondary | ICD-10-CM | POA: Diagnosis not present

## 2021-04-17 DIAGNOSIS — T401X1A Poisoning by heroin, accidental (unintentional), initial encounter: Secondary | ICD-10-CM

## 2021-04-17 LAB — COMPREHENSIVE METABOLIC PANEL
ALT: 10 U/L (ref 0–44)
AST: 25 U/L (ref 15–41)
Albumin: 4 g/dL (ref 3.5–5.0)
Alkaline Phosphatase: 98 U/L (ref 38–126)
Anion gap: 11 (ref 5–15)
BUN: 13 mg/dL (ref 6–20)
CO2: 22 mmol/L (ref 22–32)
Calcium: 8.4 mg/dL — ABNORMAL LOW (ref 8.9–10.3)
Chloride: 104 mmol/L (ref 98–111)
Creatinine, Ser: 1.2 mg/dL — ABNORMAL HIGH (ref 0.44–1.00)
GFR, Estimated: 55 mL/min — ABNORMAL LOW (ref >60)
Glucose, Bld: 119 mg/dL — ABNORMAL HIGH (ref 70–99)
Potassium: 3.7 mmol/L (ref 3.5–5.1)
Sodium: 137 mmol/L (ref 135–145)
Total Bilirubin: 0.6 mg/dL (ref 0.3–1.2)
Total Protein: 7.8 g/dL (ref 6.5–8.1)

## 2021-04-17 LAB — CBC
HCT: 41.9 % (ref 36.0–46.0)
Hemoglobin: 13.5 g/dL (ref 12.0–15.0)
MCH: 29.3 pg (ref 26.0–34.0)
MCHC: 32.2 g/dL (ref 30.0–36.0)
MCV: 91.1 fL (ref 80.0–100.0)
Platelets: 234 10*3/uL (ref 150–400)
RBC: 4.6 MIL/uL (ref 3.87–5.11)
RDW: 13.1 % (ref 11.5–15.5)
WBC: 4.6 10*3/uL (ref 4.0–10.5)
nRBC: 0 % (ref 0.0–0.2)

## 2021-04-17 LAB — URINE DRUG SCREEN, QUALITATIVE (ARMC ONLY)
Amphetamines, Ur Screen: NOT DETECTED
Barbiturates, Ur Screen: NOT DETECTED
Benzodiazepine, Ur Scrn: POSITIVE — AB
Cannabinoid 50 Ng, Ur ~~LOC~~: NOT DETECTED
Cocaine Metabolite,Ur ~~LOC~~: NOT DETECTED
MDMA (Ecstasy)Ur Screen: NOT DETECTED
Methadone Scn, Ur: NOT DETECTED
Opiate, Ur Screen: NOT DETECTED
Phencyclidine (PCP) Ur S: NOT DETECTED
Tricyclic, Ur Screen: POSITIVE — AB

## 2021-04-17 LAB — ETHANOL: Alcohol, Ethyl (B): 15 mg/dL — ABNORMAL HIGH (ref <10)

## 2021-04-17 MED ORDER — SODIUM CHLORIDE 0.9 % IV BOLUS
1000.0000 mL | Freq: Once | INTRAVENOUS | Status: AC
  Administered 2021-04-17: 1000 mL via INTRAVENOUS

## 2021-04-17 NOTE — ED Triage Notes (Addendum)
Per EMS, Pt, from parking lot, presents after overdosing on heroin.  Pt reports snorting heroin w/ a known person.  Pt denies complaints except for confusion.  Witnesses saw someone drop her off in her parking lot and leave.     Pt given 1mg  intranasal and 1mg  IV narcan.    Pt does not want ED staff to give her husband information.

## 2021-04-17 NOTE — ED Provider Notes (Signed)
[facility] Provider Note    Event Date/Time   First MD Initiated Contact with Patient 04/17/21 1733     (approximate)  History   Chief Complaint: Drug Overdose  HPI  Adrienne Savage is a 51 y.o. female presents to the emergency department after an accidental heroin overdose.  According to the patient she admits to using heroin with a friend.  Patient states she does not do drugs and this was not typical.  Patient does admit to snorting heroin, per report a friend left her to be found by bystanders received 1 mg of intranasal Narcan, followed by 1 mg of IV Narcan by EMS.  Upon arrival patient is awake alert talking.  No distress.  Is somewhat somnolent.  Patient states she does not wish for her husband to know why she is here when he arrives.  Physical Exam   Triage Vital Signs: ED Triage Vitals  Enc Vitals Group     BP --      Pulse --      Resp --      Temp --      Temp src --      SpO2 04/17/21 1728 94 %     Weight 04/17/21 1736 203 lb (92.1 kg)     Height 04/17/21 1736 5\' 4"  (1.626 m)     Head Circumference --      Peak Flow --      Pain Score 04/17/21 1736 0     Pain Loc --      Pain Edu? --      Excl. in [place]? --     Most recent vital signs: Vitals:   04/17/21 1728  SpO2: 94%    General: Awake, no distress.  Somewhat somnolent. CV:  Good peripheral perfusion.  Regular rate and rhythm  Resp:  Normal effort.  Equal breath sounds bilaterally.  Abd:  No distention.  Soft, nontender.  No rebound or guarding.   ED Results / Procedures / Treatments   EKG  EKG viewed and interpreted by myself shows a normal sinus rhythm 88 bpm with a narrow QRS, normal axis, normal intervals, no concerning ST changes.  MEDICATIONS ORDERED IN ED: Medications  sodium chloride 0.9 % bolus 1,000 mL (has no administration in time range)     IMPRESSION / MDM / ASSESSMENT AND PLAN / ED COURSE  I reviewed the triage vital signs and the nursing  notes.  Patient presents to the emergency department after an accidental heroin overdose.  No SI or HI.  Patient is not interested in detox, states she does not do drugs this was not typical of her behavior.  Patient does not wish for her husband to do anything about the reason for her ER visit.  Patient denies any other substances denies any alcohol.  We will continue to closely monitor in the emergency department.  EKG is reassuring.  We will check labs, IV hydrate and watch on cardiac monitoring/continuous pulse oximetry.    I reviewed the patient's recent medical records including her rheumatology visit from September 2022.  Does not appear to be on any chronic pain medication.  Reviewed the patient's PDMP, no opioids identified.  We will continue to closely monitor the patient in the emergency department.  Patient remains well-appearing.  No somnolence.  Patient is awake alert walking around his walker to the restroom and back.  Given the patient's reassuring work-up reassuring physical exam I believe she is now  safe for discharge home she has been in the emergency department a little over 3-1/2 hours.  Mom who is a nurse is also going to be staying with the patient at home.  FINAL CLINICAL IMPRESSION(S) / ED DIAGNOSES   Accidental heroin overdose   Note:  This document was prepared using Dragon voice recognition software and may include unintentional dictation errors.   Harvest Dark, MD 04/17/21 2054

## 2021-04-17 NOTE — ED Notes (Signed)
Pt given water 

## 2021-04-17 NOTE — ED Notes (Signed)
Gave water to pt mother for pt

## 2021-04-18 ENCOUNTER — Encounter: Payer: Self-pay | Admitting: Internal Medicine

## 2021-06-11 ENCOUNTER — Encounter: Payer: Self-pay | Admitting: Internal Medicine

## 2021-06-26 ENCOUNTER — Other Ambulatory Visit: Payer: Self-pay | Admitting: Family Medicine

## 2021-07-01 DIAGNOSIS — F331 Major depressive disorder, recurrent, moderate: Secondary | ICD-10-CM | POA: Diagnosis not present

## 2021-07-09 ENCOUNTER — Ambulatory Visit: Payer: BC Managed Care – PPO | Admitting: Internal Medicine

## 2021-07-09 ENCOUNTER — Encounter: Payer: Self-pay | Admitting: Internal Medicine

## 2021-07-09 VITALS — BP 124/84 | HR 68 | Temp 99.0°F | Resp 14 | Ht 64.0 in | Wt 197.2 lb

## 2021-07-09 DIAGNOSIS — Z6833 Body mass index (BMI) 33.0-33.9, adult: Secondary | ICD-10-CM

## 2021-07-09 DIAGNOSIS — R7989 Other specified abnormal findings of blood chemistry: Secondary | ICD-10-CM | POA: Diagnosis not present

## 2021-07-09 DIAGNOSIS — F339 Major depressive disorder, recurrent, unspecified: Secondary | ICD-10-CM | POA: Diagnosis not present

## 2021-07-09 DIAGNOSIS — G43011 Migraine without aura, intractable, with status migrainosus: Secondary | ICD-10-CM | POA: Diagnosis not present

## 2021-07-09 DIAGNOSIS — I10 Essential (primary) hypertension: Secondary | ICD-10-CM

## 2021-07-09 DIAGNOSIS — K287 Chronic gastrojejunal ulcer without hemorrhage or perforation: Secondary | ICD-10-CM

## 2021-07-09 DIAGNOSIS — E669 Obesity, unspecified: Secondary | ICD-10-CM

## 2021-07-09 DIAGNOSIS — Z0000 Encounter for general adult medical examination without abnormal findings: Secondary | ICD-10-CM | POA: Diagnosis not present

## 2021-07-09 DIAGNOSIS — E66811 Obesity, class 1: Secondary | ICD-10-CM

## 2021-07-09 DIAGNOSIS — Z9884 Bariatric surgery status: Secondary | ICD-10-CM

## 2021-07-09 LAB — BASIC METABOLIC PANEL
BUN: 10 mg/dL (ref 6–23)
CO2: 28 mEq/L (ref 19–32)
Calcium: 9.6 mg/dL (ref 8.4–10.5)
Chloride: 105 mEq/L (ref 96–112)
Creatinine, Ser: 0.91 mg/dL (ref 0.40–1.20)
GFR: 72.78 mL/min (ref >60.00)
Glucose, Bld: 91 mg/dL (ref 70–99)
Potassium: 3.9 mEq/L (ref 3.5–5.1)
Sodium: 141 mEq/L (ref 135–145)

## 2021-07-09 MED ORDER — FLUOXETINE HCL 10 MG PO CAPS: 30.0000 mg | ORAL_CAPSULE | Freq: Every day | ORAL | Status: AC

## 2021-07-09 MED ORDER — OMEPRAZOLE 40 MG PO CPDR: DELAYED_RELEASE_CAPSULE | ORAL | 3 refills | Status: DC

## 2021-07-09 MED ORDER — SUMATRIPTAN SUCCINATE 50 MG PO TABS: ORAL_TABLET | ORAL | 5 refills | Status: DC

## 2021-07-09 NOTE — Patient Instructions (Addendum)
Call your insurance and see what meds are covered for weight loss  ? ?Liraglutide Injection (Weight Management) ?What is this medication? ?LIRAGLUTIDE (LIR a GLOO tide) promotes weight loss. It may also be used to maintain weight loss. It works by decreasing appetite. Changes to diet and exercise are often combined with this medication. ?This medicine may be used for other purposes; ask your health care provider or pharmacist if you have questions. ?COMMON BRAND NAME(S): Saxenda ?What should I tell my care team before I take this medication? ?They need to know if you have any of these conditions: ?Endocrine tumors (MEN 2) or if someone in your family had these tumors ?Gallbladder disease ?High cholesterol ?History of alcohol abuse problem ?History of pancreatitis ?Kidney disease or if you are on dialysis ?Liver disease ?Previous swelling of the tongue, face, or lips with difficulty breathing, difficulty swallowing, hoarseness, or tightening of the throat ?Stomach problems ?Suicidal thoughts, plans, or attempt; a previous suicide attempt by you or a family member ?Thyroid cancer or if someone in your family had thyroid cancer ?An unusual or allergic reaction to liraglutide, other medications, foods, dyes, or preservatives ?Pregnant or trying to get pregnant ?Breast-feeding ?How should I use this medication? ?This medication is for injection under the skin of your upper leg, stomach area, or upper arm. You will be taught how to prepare and give this medication. Use exactly as directed. Take your medication at regular intervals. Do not take it more often than directed. ?This medication comes with INSTRUCTIONS FOR USE. Ask your pharmacist for directions on how to use this medication. Read the information carefully. Talk to your pharmacist or care team if you have questions. ?It is important that you put your used needles and syringes in a special sharps container. Do not put them in a trash can. If you do not have a  sharps container, call your pharmacist or care team to get one. ?A special MedGuide will be given to you by the pharmacist with each prescription and refill. Be sure to read this information carefully each time. ?Talk to your care team about the use of this medication in children. While it may be prescribed for children as young as 3 years of age for selected conditions, precautions do apply. ?Overdosage: If you think you have taken too much of this medicine contact a poison control center or emergency room at once. ?NOTE: This medicine is only for you. Do not share this medicine with others. ?What if I miss a dose? ?If you miss a dose, take it as soon as you can. If it is almost time for your next dose, take only that dose. Do not take double or extra doses. If you miss your dose for 3 days or more, call your care team to talk about how to restart this medicine. ?What may interact with this medication? ?Insulin and other medications for diabetes ?This list may not describe all possible interactions. Give your health care provider a list of all the medicines, herbs, non-prescription drugs, or dietary supplements you use. Also tell them if you smoke, drink alcohol, or use illegal drugs. Some items may interact with your medicine. ?What should I watch for while using this medication? ?Visit your care team for regular checks on your progress. ?Drink plenty of fluids while taking this medication. Check with your care team if you get an attack of severe diarrhea, nausea, and vomiting. The loss of too much body fluid can make it dangerous for you to take  this medication. ?This medication may affect blood sugar levels. Ask your care team if changes in diet or medications are needed if you have diabetes. ?Patients and their families should watch out for worsening depression or thoughts of suicide. Also watch out for sudden changes in feelings such as feeling anxious, agitated, panicky, irritable, hostile, aggressive,  impulsive, severely restless, overly excited and hyperactive, or not being able to sleep. If this happens, especially at the beginning of treatment or after a change in dose, call your care team. ?Women should inform their care team if they wish to become pregnant or think they might be pregnant. Losing weight while pregnant is not advised and may cause harm to the unborn child. Talk to your care team for more information. ?What side effects may I notice from receiving this medication? ?Side effects that you should report to your care team as soon as possible: ?Allergic reactions or angioedema--skin rash, itching, hives, swelling of the face, eyes, lips, tongue, arms, or legs, trouble swallowing or breathing ?Fast or irregular heartbeat ?Gallbladder problems--severe stomach pain, nausea, vomiting, fever ?Kidney injury--decrease in the amount of urine, swelling of the ankles, hands, or feet ?Pancreatitis--severe stomach pain that spreads to your back or gets worse after eating or when touched, fever, nausea, vomiting ?Thoughts of suicide or self-harm, worsening mood, feelings of depression ?Thyroid cancer--new mass or lump in the neck, pain or trouble swallowing, trouble breathing, hoarseness ?Side effects that usually do not require medical attention (report to your care team if they continue or are bothersome): ?Constipation ?Dizziness ?Fatigue ?Headache ?Loss of Appetite ?Nausea ?Upset stomach ?This list may not describe all possible side effects. Call your doctor for medical advice about side effects. You may report side effects to FDA at 1-800-FDA-1088. ?Where should I keep my medication? ?Keep out of the reach of children and pets. ?Store unopened pen in a refrigerator between 2 and 8 degrees C (36 and 46 degrees F). Do not freeze or use if the medication has been frozen. Protect from light and excessive heat. After you first use the pen, it can be stored at room temperature between 15 and 30 degrees C (59 and  86 degrees F) or in a refrigerator. Throw away your used pen after 30 days or after the expiration date, whichever comes first. ?Do not store your pen with the needle attached. If the needle is left on, medication may leak from the pen. ?NOTE: This sheet is a summary. It may not cover all possible information. If you have questions about this medicine, talk to your doctor, pharmacist, or health care provider. ?? 2022 Elsevier/Gold Standard (2020-04-20 00:00:00) ? ?

## 2021-07-09 NOTE — Progress Notes (Addendum)
Chief Complaint  Patient presents with   Follow-up    3 months, feeling well overall, denies any concerns.    Annual Exam   Annual 1. Htn controlled on norvasc 5 mg and hctz 12.5 mg qd  She is moving to [place] soon but will try to commuute  2. Reviewed heroin overdose 04/17/21 doing ok and does not need help with this this was fist time doing heroin she has psychiatry and therapy  Review of Systems  Constitutional:  Negative for weight loss.  HENT:  Negative for hearing loss.   Eyes:  Negative for blurred vision.  Respiratory:  Negative for shortness of breath.   Cardiovascular:  Negative for chest pain.  Gastrointestinal:  Negative for abdominal pain and blood in stool.  Genitourinary:  Negative for dysuria.  Musculoskeletal:  Negative for falls and joint pain.  Skin:  Negative for rash.  Neurological:  Negative for headaches.  Psychiatric/Behavioral:  Negative for depression.   Past Medical History:  Diagnosis Date   Allergy    Anxiety    Arthritis of right knee    with bursitis 2022 Dr. Harlow Mares did PT   Asthma    Colon cancer screening    COVID-19    10 or 01/2021   Depression    Eczema    Fatigue    Frequent headaches    Gastric ulcer    GERD (gastroesophageal reflux disease)    Goiter    Heroin overdose ([place])    03/2021 accidental   History of chicken pox    HPV in female    Hypertension    Influenza    fall winter 2022   Insomnia    Leg pain    Snoring    UTI (urinary tract infection)    Past Surgical History:  Procedure Laterality Date   CESAREAN SECTION  2008   COLONOSCOPY WITH PROPOFOL N/A 02/08/2018   Procedure: COLONOSCOPY WITH PROPOFOL;  Surgeon: Lin Landsman, MD;  Location: [facility];  Service: Gastroenterology;  Laterality: N/A;   CYSTECTOMY     cyst on lip   ESOPHAGOGASTRODUODENOSCOPY (EGD) WITH PROPOFOL N/A 02/08/2018   Procedure: ESOPHAGOGASTRODUODENOSCOPY (EGD) WITH PROPOFOL;  Surgeon: Lin Landsman, MD;  Location:  [facility];  Service: Gastroenterology;  Laterality: N/A;   GASTRIC BYPASS     8/18   MASS EXCISION  06/23/2011   Procedure: EXCISION MASS;  Surgeon: Wynonia Sours, MD;  Location: [facility];  Service: Orthopedics;  Laterality: Left;  left thumb and left index finger   MULTIPLE TOOTH EXTRACTIONS  2/12   ROTATOR CUFF REPAIR     right 09/2017 tear repair    Family History  Problem Relation Age of Onset   Heart disease Father    Glaucoma Father    Thyroid disease Mother    Hypertension Mother    Alcohol abuse Mother    Depression Mother    Prostate cancer Maternal Grandfather    Asthma Son    Learning disabilities Son    Social History   Socioeconomic History   Marital status: Married    Spouse name: Not on file   Number of children: 1    Years of education: BA   Highest education level: Not on file  Occupational History   Occupation: Engineer, structural   Tobacco Use   Smoking status: Former    Packs/day: 0.50    Types: Cigarettes    Start date: 09/22/2019   Smokeless tobacco: Never  Vaping  Use   Vaping Use: Some days  Substance and Sexual Activity   Alcohol use: Yes    Alcohol/week: 7.0 standard drinks    Types: 7 Standard drinks or equivalent per week    Comment: occasional wine drinker   Drug use: Yes    Comment: heroin   Sexual activity: Yes    Birth control/protection: None  Other Topics Concern   Not on file  Social History Narrative   Drinks about 1 cup of coffee a day, occasionally drinks a soda.    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    amLODipine (NORVASC) 5 MG tablet Take 1 tablet (5 mg total) by mouth daily.   BRIMONIDINE TARTRATE-TIMOLOL OP Apply to eye.   CALCIUM-VITAMIN D PO Take by mouth 2 (two) times daily.   doxepin (SINEQUAN) 25 MG capsule Take 25 mg by mouth.   famotidine (PEPCID) 20 MG tablet Take by mouth.   hydrochlorothiazide (HYDRODIURIL) 12.5 MG tablet Take 1 tablet (12.5 mg total) by mouth daily. In am   latanoprost (XALATAN) 0.005 % ophthalmic solution INSTILL 1 DROP(S) IN EACH EYE ONCE DAILY AT BEDTIME   Multiple Vitamin (MULTI-VITAMINS) TABS Take by mouth.   timolol (TIMOPTIC) 0.5 % ophthalmic solution timolol maleate 0.5 % eye drops   [DISCONTINUED] FLUoxetine (PROZAC) 20 MG capsule    [DISCONTINUED] omeprazole (PRILOSEC) 40 MG capsule TAKE 1 CAPSULE BY MOUTH EVERY DAY BEFORE BREAKFAST   [DISCONTINUED] PREMPRO 0.3-1.5 MG tablet Take 1 tablet by mouth daily.   [DISCONTINUED] Semaglutide-Weight Management (WEGOVY) 0.25 MG/0.5ML SOAJ Inject 0.25 mg into the skin once a week.   Current Facility-Administered Medications for the 07/09/21 encounter (Office Visit) with McLean-Scocuzza, [name], MD  Medication   betamethasone acetate-betamethasone sodium phosphate (CELESTONE) injection 3 mg   Allergies  Allergen Reactions   Cyclinex [Tetracycline Hcl] Shortness Of Breath   Demeclocycline Shortness Of Breath   Doxycycline Shortness Of Breath   Minocycline Shortness Of Breath   Tetracycline Shortness Of Breath   Tetracyclines & Related Shortness Of Breath   Prempro [Conj Estrog-Medroxyprogest Ace]     Mood worse   Other Diarrhea, Hives, Nausea Only, Other (See Comments) and Rash    CYCLINES   Recent Results (from the past 2160 hour(s))  Comprehensive metabolic panel     Status: None   Collection Time: 04/10/21  8:22 AM  Result Value Ref Range   Sodium 140 135 - 145 mEq/L   Potassium 4.5 3.5 - 5.1 mEq/L   Chloride 104 96 - 112 mEq/L   CO2  31 19 - 32 mEq/L   Glucose, Bld 88 70 - 99 mg/dL   BUN 9 6 - 23 mg/dL   Creatinine, Ser 0.92 0.40 - 1.20 mg/dL   Total Bilirubin 0.5 0.2 - 1.2 mg/dL   Alkaline Phosphatase 92 39 - 117 U/L   AST 13 0 - 37 U/L   ALT 9 0 - 35  U/L   Total Protein 6.9 6.0 - 8.3 g/dL   Albumin 4.1 3.5 - 5.2 g/dL   GFR 71.95 >60.00 mL/min    Comment: Calculated using the CKD-EPI Creatinine Equation (2021)   Calcium 8.8 8.4 - 10.5 mg/dL  Lipid panel     Status: Abnormal   Collection Time: 04/10/21  8:22 AM  Result Value Ref Range   Cholesterol 174 0 - 200 mg/dL    Comment: ATP III Classification       Desirable:  < 200 mg/dL               Borderline High:  200 - 239 mg/dL          High:  > = 240 mg/dL   Triglycerides 96.0 0.0 - 149.0 mg/dL    Comment: Normal:  <150 mg/dLBorderline High:  150 - 199 mg/dL   HDL 55.40 >39.00 mg/dL   VLDL 19.2 0.0 - 40.0 mg/dL   LDL Cholesterol 100 (H) 0 - 99 mg/dL   Total CHOL/HDL Ratio 3     Comment:                Men          Women1/2 Average Risk     3.4          3.3Average Risk          5.0          4.42X Average Risk          9.6          7.13X Average Risk          15.0          11.0                       NonHDL 119.09     Comment: NOTE:  Non-HDL goal should be 30 mg/dL higher than patient's LDL goal (i.e. LDL goal of < 70 mg/dL, would have non-HDL goal of < 100 mg/dL)  Hemoglobin A1c     Status: None   Collection Time: 04/10/21  8:22 AM  Result Value Ref Range   Hgb A1c MFr Bld 5.3 4.6 - 6.5 %    Comment: Glycemic Control Guidelines for People with Diabetes:Non Diabetic:  <6%Goal of Therapy: <7%Additional Action Suggested:  >8%   TSH     Status: None   Collection Time: 04/10/21  8:22 AM  Result Value Ref Range   TSH 0.79 0.35 - 5.50 uIU/mL  Vitamin B12     Status: None   Collection Time: 04/10/21  8:22 AM  Result Value Ref Range   Vitamin B-12 681 211 - 911 pg/mL  Vitamin D (25 hydroxy)     Status: None   Collection Time: 04/10/21  8:22 AM  Result Value Ref  Range   VITD 35.63 30.00 - 100.00 ng/mL  IBC + Ferritin     Status: None   Collection Time: 04/10/21  8:22 AM  Result Value Ref Range   Iron 110 42 - 145 ug/dL   Transferrin 311.0 212.0 - 360.0 mg/dL   Saturation Ratios 25.3 20.0 - 50.0 %   Ferritin 24.4 10.0 - 291.0 ng/mL   TIBC 435.4 250.0 - 450.0 mcg/dL  Urinalysis w microscopic + reflex cultur     Status: None   Collection Time: 04/10/21  8:22 AM   Specimen: Urine  Result Value Ref Range   Color, Urine YELLOW YELLOW   APPearance  CLEAR CLEAR   Specific Gravity, Urine 1.017 1.001 - 1.035   pH 7.0 5.0 - 8.0   Glucose, UA NEGATIVE NEGATIVE   Bilirubin Urine NEGATIVE NEGATIVE   Ketones, ur NEGATIVE NEGATIVE   Hgb urine dipstick NEGATIVE NEGATIVE   Protein, ur NEGATIVE NEGATIVE   Nitrites, Initial NEGATIVE NEGATIVE   Leukocyte Esterase NEGATIVE NEGATIVE   WBC, UA NONE SEEN 0 - 5 /HPF   RBC / HPF NONE SEEN 0 - 2 /HPF   Squamous Epithelial / LPF 0-5 < OR = 5 /HPF   Bacteria, UA NONE SEEN NONE SEEN /HPF   Hyaline Cast NONE SEEN NONE SEEN /LPF   Note      Comment: This urine was analyzed for the presence of WBC,  RBC, bacteria, casts, and other formed elements.  Only those elements seen were reported. . .   REFLEXIVE URINE CULTURE     Status: None   Collection Time: 04/10/21  8:22 AM  Result Value Ref Range   Reflexve Urine Culture      Comment: NO CULTURE INDICATED  CBC     Status: None   Collection Time: 04/17/21  5:38 PM  Result Value Ref Range   WBC 4.6 4.0 - 10.5 K/uL   RBC 4.60 3.87 - 5.11 MIL/uL   Hemoglobin 13.5 12.0 - 15.0 g/dL   HCT 41.9 36.0 - 46.0 %   MCV 91.1 80.0 - 100.0 fL   MCH 29.3 26.0 - 34.0 pg   MCHC 32.2 30.0 - 36.0 g/dL   RDW 13.1 11.5 - 15.5 %   Platelets 234 150 - 400 K/uL   nRBC 0.0 0.0 - 0.2 %    Comment: Performed at [facility], [address] [place], [place] [address]  Comprehensive metabolic panel     Status: Abnormal   Collection Time: 04/17/21  5:38 PM  Result Value Ref  Range   Sodium 137 135 - 145 mmol/L   Potassium 3.7 3.5 - 5.1 mmol/L   Chloride 104 98 - 111 mmol/L   CO2 22 22 - 32 mmol/L   Glucose, Bld 119 (H) 70 - 99 mg/dL    Comment: Glucose reference range applies only to samples taken after fasting for at least 8 hours.   BUN 13 6 - 20 mg/dL   Creatinine, Ser 1.20 (H) 0.44 - 1.00 mg/dL   Calcium 8.4 (L) 8.9 - 10.3 mg/dL   Total Protein 7.8 6.5 - 8.1 g/dL   Albumin 4.0 3.5 - 5.0 g/dL   AST 25 15 - 41 U/L   ALT 10 0 - 44 U/L   Alkaline Phosphatase 98 38 - 126 U/L   Total Bilirubin 0.6 0.3 - 1.2 mg/dL   GFR, Estimated 55 (L) >60 mL/min    Comment: (NOTE) Calculated using the CKD-EPI Creatinine Equation (2021)    Anion gap 11 5 - 15    Comment: Performed at [facility], [address] [place], [place] [address]  Ethanol     Status: Abnormal   Collection Time: 04/17/21  5:38 PM  Result Value Ref Range   Alcohol, Ethyl (B) 15 (H) <10 mg/dL    Comment: (NOTE) Lowest detectable limit for serum alcohol is 10 mg/dL.  For medical purposes only. Performed at [facility], [address] [place],  [address]   Urine Drug Screen, Qualitative [facility] only)     Status: Abnormal   Collection Time: 04/17/21  8:29 PM  Result Value Ref Range  Tricyclic, Ur Screen POSITIVE (A) NONE DETECTED   Amphetamines, Ur Screen NONE DETECTED NONE DETECTED   MDMA (Ecstasy)Ur Screen NONE DETECTED NONE DETECTED   Cocaine Metabolite,Ur Scenic NONE DETECTED NONE DETECTED   Opiate, Ur Screen NONE DETECTED NONE DETECTED   Phencyclidine (PCP) Ur S NONE DETECTED NONE DETECTED   Cannabinoid 50 Ng, Ur Elim NONE DETECTED NONE DETECTED   Barbiturates, Ur Screen NONE DETECTED NONE DETECTED   Benzodiazepine, Ur Scrn POSITIVE (A) NONE DETECTED   Methadone Scn, Ur NONE DETECTED NONE DETECTED    Comment: (NOTE) Tricyclics + metabolites, urine    Cutoff 1000 ng/mL Amphetamines + metabolites, urine  Cutoff 1000 ng/mL MDMA (Ecstasy), urine               Cutoff 500 ng/mL Cocaine Metabolite, urine          Cutoff 300 ng/mL Opiate + metabolites, urine        Cutoff 300 ng/mL Phencyclidine (PCP), urine         Cutoff 25 ng/mL Cannabinoid, urine                 Cutoff 50 ng/mL Barbiturates + metabolites, urine  Cutoff 200 ng/mL Benzodiazepine, urine              Cutoff 200 ng/mL Methadone, urine                   Cutoff 300 ng/mL  The urine drug screen provides only a preliminary, unconfirmed analytical test result and should not be used for non-medical purposes. Clinical consideration and professional judgment should be applied to any positive drug screen result due to possible interfering substances. A more specific alternate chemical method must be used in order to obtain a confirmed analytical result. Gas chromatography / mass spectrometry (GC/MS) is the preferred confirm atory method. Performed at [facility], [address] [place], [place] [address]    Objective  Body mass index is 33.85 kg/m. Wt Readings from Last 3 Encounters:  07/09/21 197 lb 3.2 oz (89.4 kg)  04/17/21 203 lb (92.1 kg)  04/10/21 203 lb (92.1 kg)   Temp Readings from Last 3 Encounters:  07/09/21 99 F (37.2 C) (Oral)  04/10/21 (!) 96.8 F (36 C) (Temporal)  12/21/20 (!) 100.6 F (38.1 C)   BP Readings from Last 3 Encounters:  07/09/21 124/84  04/17/21 136/88  04/10/21 138/88   Pulse Readings from Last 3 Encounters:  07/09/21 68  04/17/21 99  04/10/21 84    Physical Exam Vitals and nursing note reviewed.  Constitutional:      Appearance: Normal appearance. She is well-developed and well-groomed.  HENT:     Head: Normocephalic and atraumatic.  Eyes:     Conjunctiva/sclera: Conjunctivae normal.     Pupils: Pupils are equal, round, and reactive to light.  Cardiovascular:     Rate and Rhythm: Normal rate and regular rhythm.     Heart sounds: Normal heart sounds. No murmur heard. Pulmonary:     Effort: Pulmonary effort is  normal.     Breath sounds: Normal breath sounds.  Abdominal:     General: Abdomen is flat. Bowel sounds are normal.     Tenderness: There is no abdominal tenderness.  Musculoskeletal:        General: No tenderness.  Skin:    General: Skin is warm and dry.  Neurological:     General: No focal deficit present.     Mental Status: She is alert and  oriented to person, place, and time. Mental status is at baseline.     Cranial Nerves: Cranial nerves 2-12 are intact.     Gait: Gait is intact.  Psychiatric:        Attention and Perception: Attention and perception normal.        Mood and Affect: Mood and affect normal.        Speech: Speech normal.        Behavior: Behavior normal. Behavior is cooperative.        Thought Content: Thought content normal.        Cognition and Memory: Cognition and memory normal.        Judgment: Judgment normal.    Assessment  Plan  Annual physical exam See below   Elevated serum creatinine 04/17/21 with heroin overdose - Plan: Basic Metabolic Panel (BMET) Hydration with water 55-64 ounces daily  Depression, recurrent ([place]) - Plan: FLUoxetine (PROZAC) 10 MG capsule F/u psych and therapy    Intractable migraine without aura and with status migrainosus - Plan: SUMAtriptan (IMITREX) 50 MG tablet  Chronic gastrojejunal anastomotic ulcer - Plan: omeprazole (PRILOSEC) 40 MG capsule   Obesity s/p wt loss surgery I.e gastric bypass  with weight gain h/o gastric ulcer and h/o HTN could not tolerate stimulants due to this Rx wegovy Dr. Jacklynn Lewis, My plan will cover Wegovy, Saxenda, Qsymia, Lomaira and Xenical- they just need prior authorization to be approved. Thanks,  Merilynn   HM Flu shot last 2020  Tdap utd  Declines covid 19  Declines HIV check  Declines MMR not immune  covid 2/2    Hep C neg 07/05/20    Pap records Dr. Raphael Gibney [facility] [place] h/o HPV/abnormal pap  -09/11/17 neg pap no HPV, 10/15/20 neg     Mammogram OB/GYN had 09/11/17 [facility]  negative  -12/18/20 neg [facility] ob/gyn   Colonoscopy   02/08/18 Dr. Marius Ditch due again in 2029    Ortho Dr. Malena Catholic ortho  Dr. Emelda Brothers Bariatric surgery    [facility] wears glasses FH glaucoma being monitored for elevated pressure in her eye not yet dx'ed glaucoma    Obtained records ob/GYN above [facility] cerv[facility]x 04/1999  Colp 03/06/98-09/20/97  H/o HSV Former smoker quit 11/2018 congrats  rec healthy diet and exercise    Obesity let me know what insurance will cover Provider: Dr. [name] McLean-Scocuzza-Internal Medicine

## 2021-07-10 ENCOUNTER — Encounter: Payer: Self-pay | Admitting: Internal Medicine

## 2021-07-15 ENCOUNTER — Other Ambulatory Visit: Payer: Self-pay | Admitting: Internal Medicine

## 2021-07-15 DIAGNOSIS — Z9884 Bariatric surgery status: Secondary | ICD-10-CM

## 2021-07-15 DIAGNOSIS — E669 Obesity, unspecified: Secondary | ICD-10-CM | POA: Insufficient documentation

## 2021-07-15 DIAGNOSIS — F331 Major depressive disorder, recurrent, moderate: Secondary | ICD-10-CM | POA: Diagnosis not present

## 2021-07-15 MED ORDER — WEGOVY 0.5 MG/0.5ML ~~LOC~~ SOAJ: 0.5000 mg | SUBCUTANEOUS | 0 refills | Status: DC

## 2021-07-15 MED ORDER — WEGOVY 0.25 MG/0.5ML ~~LOC~~ SOAJ: 0.2500 mg | SUBCUTANEOUS | 0 refills | Status: DC

## 2021-07-15 MED ORDER — WEGOVY 1 MG/0.5ML ~~LOC~~ SOAJ: 1.0000 mg | SUBCUTANEOUS | 0 refills | Status: DC

## 2021-07-15 MED ORDER — WEGOVY 2.4 MG/0.75ML ~~LOC~~ SOAJ: 2.4000 mg | SUBCUTANEOUS | 2 refills | Status: DC

## 2021-07-15 MED ORDER — WEGOVY 1.7 MG/0.75ML ~~LOC~~ SOAJ: 1.7000 mg | SUBCUTANEOUS | 0 refills | Status: DC

## 2021-07-15 NOTE — Telephone Encounter (Signed)
Was PA done  ?If not initiate PA  ?If alternative needed after PA will send in  ?Thank you ?

## 2021-07-15 NOTE — Addendum Note (Signed)
Addended by: Orland Mustard on: 07/15/2021 12:55 AM ? ? Modules accepted: Orders ? ?

## 2021-07-29 DIAGNOSIS — F331 Major depressive disorder, recurrent, moderate: Secondary | ICD-10-CM | POA: Diagnosis not present

## 2021-07-30 ENCOUNTER — Encounter: Payer: Self-pay | Admitting: Internal Medicine

## 2021-07-30 NOTE — Telephone Encounter (Signed)
Was PA done for wegovy? ? ?

## 2021-08-01 DIAGNOSIS — F329 Major depressive disorder, single episode, unspecified: Secondary | ICD-10-CM | POA: Diagnosis not present

## 2021-08-01 DIAGNOSIS — F419 Anxiety disorder, unspecified: Secondary | ICD-10-CM | POA: Diagnosis not present

## 2021-08-06 ENCOUNTER — Telehealth: Payer: Self-pay

## 2021-08-06 ENCOUNTER — Encounter: Payer: Self-pay | Admitting: Internal Medicine

## 2021-08-06 NOTE — Telephone Encounter (Addendum)
Initiated prior authorization via covermymeds for pt's Semaglutide-Weight Management (WEGOVY) 0.25 MG/0.5ML SOAJ on 08/06/21 ? ?Key: [id number] ? ?Awaiting approval or denial. ?

## 2021-08-09 NOTE — Telephone Encounter (Signed)
Received fax from [facility] in regards to pt's Semaglutide-Weight Management (WEGOVY) 0.25 MG/0.5ML SOAJ request was Denied b/c it is not a covered benefit  ?

## 2021-08-12 ENCOUNTER — Encounter: Payer: Self-pay | Admitting: Internal Medicine

## 2021-08-12 DIAGNOSIS — F331 Major depressive disorder, recurrent, moderate: Secondary | ICD-10-CM | POA: Diagnosis not present

## 2021-08-20 DIAGNOSIS — H401132 Primary open-angle glaucoma, bilateral, moderate stage: Secondary | ICD-10-CM | POA: Diagnosis not present

## 2021-09-23 DIAGNOSIS — F331 Major depressive disorder, recurrent, moderate: Secondary | ICD-10-CM | POA: Diagnosis not present

## 2021-09-24 DIAGNOSIS — H401132 Primary open-angle glaucoma, bilateral, moderate stage: Secondary | ICD-10-CM | POA: Diagnosis not present

## 2021-10-11 ENCOUNTER — Other Ambulatory Visit: Payer: Self-pay

## 2021-10-11 ENCOUNTER — Other Ambulatory Visit: Payer: Self-pay | Admitting: Internal Medicine

## 2021-10-11 DIAGNOSIS — G43011 Migraine without aura, intractable, with status migrainosus: Secondary | ICD-10-CM

## 2021-10-14 DIAGNOSIS — F331 Major depressive disorder, recurrent, moderate: Secondary | ICD-10-CM | POA: Diagnosis not present

## 2021-10-22 ENCOUNTER — Encounter: Payer: Self-pay | Admitting: Internal Medicine

## 2021-10-22 DIAGNOSIS — Z6833 Body mass index (BMI) 33.0-33.9, adult: Secondary | ICD-10-CM | POA: Insufficient documentation

## 2021-10-22 MED ORDER — WEGOVY 1.7 MG/0.75ML ~~LOC~~ SOAJ: 1.7000 mg | SUBCUTANEOUS | 0 refills | Status: DC

## 2021-10-22 MED ORDER — WEGOVY 0.5 MG/0.5ML ~~LOC~~ SOAJ: 0.5000 mg | SUBCUTANEOUS | 0 refills | Status: DC

## 2021-10-22 MED ORDER — WEGOVY 1 MG/0.5ML ~~LOC~~ SOAJ: 1.0000 mg | SUBCUTANEOUS | 0 refills | Status: DC

## 2021-10-22 MED ORDER — WEGOVY 0.25 MG/0.5ML ~~LOC~~ SOAJ: 0.2500 mg | SUBCUTANEOUS | 0 refills | Status: DC

## 2021-10-22 MED ORDER — WEGOVY 2.4 MG/0.75ML ~~LOC~~ SOAJ: 2.4000 mg | SUBCUTANEOUS | 2 refills | Status: DC

## 2021-10-22 NOTE — Addendum Note (Signed)
Addended by: Orland Mustard on: 10/22/2021 02:27 PM   Modules accepted: Orders

## 2021-10-24 ENCOUNTER — Telehealth: Payer: Self-pay

## 2021-10-24 NOTE — Telephone Encounter (Signed)
Initiated PA via covermymeds on 10/24/21 for pt's Wegovy 0.'25MG'$ /0.5ML auto-injectors  Key:[id number] Rx #:[id number]  Awaiting denial or approval

## 2021-11-11 ENCOUNTER — Telehealth: Payer: Self-pay

## 2021-11-11 NOTE — Telephone Encounter (Signed)
Started a PA for pts Wegovy on 11-11-21  [name] (KeyTeodoro Kil) Rx #: [id number] 0.'5MG'$ /0.5ML auto-injectors   Form [employer] [place] Commercial Electronic Request Form (CB)

## 2021-11-11 NOTE — Telephone Encounter (Signed)
PA has been APPROVED for pt's Wegovy 0.'25MG'$ /0.5ML auto-injectors  Effective from:10/24/2021-02/26/2022.

## 2021-11-12 ENCOUNTER — Telehealth: Payer: Self-pay

## 2021-11-12 NOTE — Telephone Encounter (Signed)
Received a fax of an approval of Ozempic 0.25 which was already documented

## 2021-11-16 ENCOUNTER — Encounter: Payer: Self-pay | Admitting: Internal Medicine

## 2021-11-18 ENCOUNTER — Other Ambulatory Visit: Payer: Self-pay | Admitting: Internal Medicine

## 2021-11-18 DIAGNOSIS — I10 Essential (primary) hypertension: Secondary | ICD-10-CM

## 2021-11-18 DIAGNOSIS — K287 Chronic gastrojejunal ulcer without hemorrhage or perforation: Secondary | ICD-10-CM

## 2021-11-18 DIAGNOSIS — G43011 Migraine without aura, intractable, with status migrainosus: Secondary | ICD-10-CM

## 2021-11-18 DIAGNOSIS — R0981 Nasal congestion: Secondary | ICD-10-CM

## 2021-11-18 MED ORDER — SUMATRIPTAN SUCCINATE 50 MG PO TABS: ORAL_TABLET | ORAL | 3 refills | Status: AC

## 2021-11-18 MED ORDER — AMLODIPINE BESYLATE 5 MG PO TABS: 5.0000 mg | ORAL_TABLET | Freq: Every day | ORAL | 3 refills | Status: AC

## 2021-11-18 MED ORDER — FLUTICASONE PROPIONATE 50 MCG/ACT NA SUSP: NASAL | 11 refills | Status: DC

## 2021-11-18 MED ORDER — SAXENDA 18 MG/3ML ~~LOC~~ SOPN: 0.6000 mg | PEN_INJECTOR | Freq: Every day | SUBCUTANEOUS | 11 refills | Status: DC

## 2021-11-18 MED ORDER — HYDROCHLOROTHIAZIDE 12.5 MG PO TABS: 12.5000 mg | ORAL_TABLET | Freq: Every day | ORAL | 3 refills | Status: AC

## 2021-11-18 MED ORDER — OMEPRAZOLE 40 MG PO CPDR: DELAYED_RELEASE_CAPSULE | ORAL | 3 refills | Status: AC

## 2021-11-18 NOTE — Addendum Note (Signed)
Addended by: Orland Mustard on: 11/18/2021 05:38 PM   Modules accepted: Orders

## 2021-11-21 DIAGNOSIS — F419 Anxiety disorder, unspecified: Secondary | ICD-10-CM | POA: Diagnosis not present

## 2021-11-21 DIAGNOSIS — F329 Major depressive disorder, single episode, unspecified: Secondary | ICD-10-CM | POA: Diagnosis not present

## 2021-11-25 ENCOUNTER — Telehealth: Payer: Self-pay

## 2021-11-25 NOTE — Telephone Encounter (Signed)
PA for Saxenda was started on today (11/25/21) awaiting approval or denial.   Adrienne Savage Blood  (Key: [id number]) Rx #: [id number] Saxenda '18MG'$ Fayne Mediate pen-injectors   Form: Sherre Poot [place] Commercial Electronic Request Form (CB) Created: 7 days ago  Wait for Determination Please wait for [employer] to return a determination.

## 2021-12-06 ENCOUNTER — Encounter: Payer: Self-pay | Admitting: Internal Medicine

## 2021-12-06 MED ORDER — SHARPS CONTAINER MISC: 1.0000 | 1 refills | Status: AC | PRN

## 2021-12-06 MED ORDER — PEN NEEDLES 31G X 6 MM MISC: 1.0000 | Freq: Every day | 3 refills | Status: DC

## 2021-12-09 ENCOUNTER — Other Ambulatory Visit: Payer: Self-pay

## 2021-12-09 ENCOUNTER — Other Ambulatory Visit: Payer: Self-pay | Admitting: Internal Medicine

## 2021-12-09 DIAGNOSIS — Z6833 Body mass index (BMI) 33.0-33.9, adult: Secondary | ICD-10-CM

## 2021-12-09 DIAGNOSIS — R110 Nausea: Secondary | ICD-10-CM

## 2021-12-09 DIAGNOSIS — E669 Obesity, unspecified: Secondary | ICD-10-CM

## 2021-12-09 DIAGNOSIS — Z6841 Body Mass Index (BMI) 40.0 and over, adult: Secondary | ICD-10-CM

## 2021-12-09 DIAGNOSIS — I10 Essential (primary) hypertension: Secondary | ICD-10-CM

## 2021-12-09 DIAGNOSIS — E66811 Obesity, class 1: Secondary | ICD-10-CM

## 2021-12-09 MED ORDER — PEN NEEDLES 31G X 6 MM MISC: 1.0000 | Freq: Every day | 3 refills | Status: DC

## 2021-12-09 MED ORDER — ONDANSETRON HCL 4 MG PO TABS: 4.0000 mg | ORAL_TABLET | Freq: Three times a day (TID) | ORAL | 2 refills | Status: AC | PRN

## 2021-12-18 ENCOUNTER — Encounter: Payer: Self-pay | Admitting: Emergency Medicine

## 2021-12-18 ENCOUNTER — Ambulatory Visit
Admission: EM | Admit: 2021-12-18 | Discharge: 2021-12-18 | Disposition: A | Discharge status: Home / Self Care | Payer: BC Managed Care – PPO | Attending: Family Medicine | Admitting: Family Medicine

## 2021-12-18 DIAGNOSIS — R062 Wheezing: Secondary | ICD-10-CM

## 2021-12-18 DIAGNOSIS — R030 Elevated blood-pressure reading, without diagnosis of hypertension: Secondary | ICD-10-CM | POA: Diagnosis not present

## 2021-12-18 DIAGNOSIS — J209 Acute bronchitis, unspecified: Secondary | ICD-10-CM | POA: Diagnosis not present

## 2021-12-18 MED ORDER — AMOXICILLIN-POT CLAVULANATE 875-125 MG PO TABS: 1.0000 | ORAL_TABLET | Freq: Two times a day (BID) | ORAL | 0 refills | Status: AC

## 2021-12-18 MED ORDER — PREDNISONE 20 MG PO TABS: 40.0000 mg | ORAL_TABLET | Freq: Every day | ORAL | 0 refills | Status: AC

## 2021-12-18 NOTE — Discharge Instructions (Signed)
Make sure you are drinking lots of fluids Take the antibiotic 2 times a day for a week Take prednisone once a day for 5 days May use over-the-counter cough or cold medicines if needed  Your blood pressure is elevated today.  Follow-up with your primary care doctor

## 2021-12-18 NOTE — ED Provider Notes (Signed)
[facility]    CSN: [id number] Arrival date & time: 12/18/21  1816      History   Chief Complaint Chief Complaint  Patient presents with   Cough    HPI Adrienne Savage is a 51 y.o. female.   HPI  Patient states that she is prone to sinusitis and bronchitis.  She has been coughing and having chest congestion and nasal congestion.  She states has been going on for several days.  She did have a COVID test done 2 to 3 days ago, it was 2 or 3 days into her illness, and it was negative.  She states that she has nasal congestion, chest congestion, coughing up brown sputum.  She has shortness of breath and discomfort in her chest.  She does have wheezing when she has bronchitis.  Past Medical History:  Diagnosis Date   Allergy    Anxiety    Arthritis of right knee    with bursitis 2022 Dr. Harlow Mares did PT   Asthma    Colon cancer screening    COVID-19    10 or 01/2021   Depression    Eczema    Fatigue    Frequent headaches    Gastric ulcer    GERD (gastroesophageal reflux disease)    Goiter    Heroin overdose ([place])    03/2021 accidental   History of chicken pox    HPV in female    Hypertension    Influenza    fall winter 2022   Insomnia    Leg pain    Snoring    UTI (urinary tract infection)     Patient Active Problem List   Diagnosis Date Noted   BMI 33.0-33.9,adult 10/22/2021   Obesity (BMI 30.0-34.9) 07/15/2021   Viral upper respiratory tract infection 07/31/2020   Suspected COVID-19 virus infection 07/31/2020   Glaucoma 10/06/2019   Iron deficiency 10/06/2019   Chronic gastric ulcer without hemorrhage and without perforation 04/07/2019   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   Abdominal pain, chronic, epigastric    Insomnia 12/22/2017   Migraine 12/17/2017   Hot flashes 12/17/2017   Multiple thyroid nodules 12/17/2017   Environmental allergies 12/17/2017   Chronic sinusitis 12/17/2017   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   Obesity 01/15/2017   Tobacco abuse 01/15/2017   Morbid obesity with BMI of 40.0-44.9, adult ([place]) 07/18/2016   Anxiety 05/27/2016   Osteoarthritis of knee 05/27/2016   Essential hypertension 09/04/2015   Obesity with body mass index 30 or greater 08/26/2011    Past Surgical History:  Procedure Laterality Date   CESAREAN SECTION  2008   COLONOSCOPY WITH PROPOFOL N/A 02/08/2018   Procedure: COLONOSCOPY WITH PROPOFOL;  Surgeon: Lin Landsman, MD;  Location: [facility] ENDOSCOPY;  Service: Gastroenterology;  Laterality: N/A;   CYSTECTOMY     cyst on lip   ESOPHAGOGASTRODUODENOSCOPY (EGD) WITH PROPOFOL N/A 02/08/2018   Procedure: ESOPHAGOGASTRODUODENOSCOPY (EGD) WITH PROPOFOL;  Surgeon: Lin Landsman, MD;  Location: [place];  Service: Gastroenterology;  Laterality: N/A;   GASTRIC BYPASS     8/18   MASS EXCISION  06/23/2011   Procedure: EXCISION MASS;  Surgeon: Wynonia Sours, MD;  Location: [place];  Service: Orthopedics;  Laterality: Left;  left thumb and left index finger   MULTIPLE TOOTH EXTRACTIONS  2/12   ROTATOR CUFF REPAIR     right 09/2017 tear repair  OB History   No obstetric history on file.      Home Medications    Prior to Admission medications   Medication Sig Start Date End Date Taking? Authorizing Provider  amoxicillin-clavulanate (AUGMENTIN) 875-125 MG tablet Take 1 tablet by mouth every 12 (twelve) hours. 12/18/21  Yes Raylene Everts, MD  predniSONE (DELTASONE) 20 MG tablet Take 2 tablets (40 mg total) by mouth daily with breakfast. 12/18/21  Yes Raylene Everts, MD  ALPRAZolam Duanne Moron) 0.5 MG tablet Take 0.5 mg by mouth 2 (two) times daily as needed.  12/07/17   [provider]  amLODipine (NORVASC) 5 MG tablet Take 1 tablet (5 mg total) by mouth daily. 11/18/21   McLean-Scocuzza, [name], MD  BRIMONIDINE TARTRATE-TIMOLOL OP Apply to eye.    [provider]   CALCIUM-VITAMIN D PO Take by mouth 2 (two) times daily.    [provider]  famotidine (PEPCID) 20 MG tablet Take by mouth.    [provider]  FLUoxetine (PROZAC) 10 MG capsule Take 3 capsules (30 mg total) by mouth daily. Per psychiatry 07/09/21   McLean-Scocuzza, [name], MD  hydrochlorothiazide (HYDRODIURIL) 12.5 MG tablet Take 1 tablet (12.5 mg total) by mouth daily. In am 11/18/21   McLean-Scocuzza, [name], MD  Insulin Pen Needle (PEN NEEDLES) 31G X 6 MM MISC 1 Application by Does not apply route daily. With saxenda or wegovy 12/09/21   McLean-Scocuzza, [name], MD  latanoprost (XALATAN) 0.005 % ophthalmic solution INSTILL 1 DROP(S) IN [facility] EYE ONCE DAILY AT BEDTIME 02/04/18   [provider]  Liraglutide -Weight Management (SAXENDA) 18 MG/3ML SOPN Inject 0.6 mg into the skin daily. X 1 week then increase to 1.2 qd x 1 week then increase to 1.8 daily x 1 week then increase to 2.4 daily x 1 week then increase to 3 mg qd and beyond if tolerating 11/18/21   McLean-Scocuzza, [name], MD  Multiple Vitamin (MULTI-VITAMINS) TABS Take by mouth.    [provider]  omeprazole (PRILOSEC) 40 MG capsule TAKE 1 CAPSULE BY MOUTH EVERY DAY BEFORE BREAKFAST 11/18/21   McLean-Scocuzza, [name], MD  ondansetron (ZOFRAN) 4 MG tablet Take 1 tablet (4 mg total) by mouth every 8 (eight) hours as needed. 12/09/21   McLean-Scocuzza, [name], MD  sharps container 1 each by Does not apply route as needed. 12/06/21   McLean-Scocuzza, [name], MD  SUMAtriptan (IMITREX) 50 MG tablet MAY REPEAT IN 2 HOURS IF HEADACHE PERSISTS OR RECURS. MAX 4 PILLS IN 24 HOURS 11/18/21   McLean-Scocuzza, [name], MD  timolol (TIMOPTIC) 0.5 % ophthalmic solution timolol maleate 0.5 % eye drops    [provider]    Family History Family History  Problem Relation Age of Onset   Heart disease Father    Glaucoma Father    Thyroid disease Mother    Hypertension Mother    Alcohol abuse Mother     Depression Mother    Prostate cancer Maternal Grandfather    Asthma Son    Learning disabilities Son     Social History Social History   Tobacco Use   Smoking status: Former    Packs/day: 0.50    Types: Cigarettes    Start date: 09/22/2019   Smokeless tobacco: Never  Vaping Use   Vaping Use: Every day  Substance Use Topics   Alcohol use: Yes    Alcohol/week: 7.0 standard drinks of alcohol    Types: 7 Standard drinks or equivalent  per week    Comment: occasional wine drinker   Drug use: Yes    Comment: heroin     Allergies   Tetracyclines & related and Prempro [conj estrog-medroxyprogest ace]   Review of Systems Review of Systems See HPI  Physical Exam Triage Vital Signs ED Triage Vitals  Enc Vitals Group     BP 12/18/21 1834 (!) 158/107     Pulse Rate 12/18/21 1834 75     Resp 12/18/21 1834 18     Temp 12/18/21 1834 98.6 F (37 C)     Temp Source 12/18/21 1834 Oral     SpO2 12/18/21 1834 97 %     Weight --      Height --      Head Circumference --      Peak Flow --      Pain Score 12/18/21 1836 0     Pain Loc --      Pain Edu? --      Excl. in [place]? --    No data found.  Updated Vital Signs BP (!) 146/91 (BP Location: Right Arm)   Pulse 75   Temp 98.6 F (37 C) (Oral)   Resp 18   LMP 08/08/2016 (Approximate) Comment: neg preg test 11/10/17  SpO2 97%     Physical Exam Constitutional:      General: She is not in acute distress.    Appearance: She is well-developed. She is obese.  HENT:     Head: Normocephalic and atraumatic.     Right Ear: Tympanic membrane and ear canal normal.     Left Ear: Tympanic membrane and ear canal normal.     Nose: Congestion present.     Mouth/Throat:     Pharynx: Posterior oropharyngeal erythema present.  Eyes:     Conjunctiva/sclera: Conjunctivae normal.     Pupils: Pupils are equal, round, and reactive to light.  Cardiovascular:     Rate and Rhythm: Normal rate and regular rhythm.     Heart sounds: Normal heart  sounds.  Pulmonary:     Effort: Pulmonary effort is normal. No respiratory distress.     Breath sounds: Wheezing, rhonchi and rales present.  Abdominal:     General: There is no distension.     Palpations: Abdomen is soft.  Musculoskeletal:        General: Normal range of motion.     Cervical back: Normal range of motion.  Lymphadenopathy:     Cervical: No cervical adenopathy.  Skin:    General: Skin is warm and dry.  Neurological:     General: No focal deficit present.     Mental Status: She is alert.  Psychiatric:        Mood and Affect: Mood normal.        Behavior: Behavior normal.      UC Treatments / Results  Labs (all labs ordered are listed, but only abnormal results are displayed) Labs Reviewed - No data to display  EKG   Radiology No results found.  Procedures Procedures (including critical care time)  Medications Ordered in UC Medications - No data to display  Initial Impression / Assessment and Plan / UC Course  I have reviewed the triage vital signs and the nursing notes.  Pertinent labs & imaging results that were available during my care of the patient were reviewed by me and considered in my medical decision making (see chart for details).    Discussed elevated blood pressure.  At  discharge it had improved Patient has persistent viral symptoms with brown sputum, wheezing, chest congestion.  We will add an antibiotic Final Clinical Impressions(s) / UC Diagnoses   Final diagnoses:  Acute bronchitis, unspecified organism  Wheezing  Elevated blood pressure reading     Discharge Instructions      Make sure you are drinking lots of fluids Take the antibiotic 2 times a day for a week Take prednisone once a day for 5 days May use over-the-counter cough or cold medicines if needed  Your blood pressure is elevated today.  Follow-up with your primary care doctor   ED Prescriptions     Medication Sig Dispense Auth. Provider    amoxicillin-clavulanate (AUGMENTIN) 875-125 MG tablet Take 1 tablet by mouth every 12 (twelve) hours. 14 tablet Raylene Everts, MD   predniSONE (DELTASONE) 20 MG tablet Take 2 tablets (40 mg total) by mouth daily with breakfast. 10 tablet Raylene Everts, MD      PDMP not reviewed this encounter.   Raylene Everts, MD 12/18/21 [phone]

## 2021-12-18 NOTE — ED Triage Notes (Signed)
Pt states she is coughing mucous and has a lot of chest and nasal congestion. States she was tested for covid at the dr this weekend and negative but cough and congestion are getting worse. Taking OTC cold meds

## 2021-12-26 ENCOUNTER — Telehealth: Payer: Self-pay | Admitting: Internal Medicine

## 2021-12-26 ENCOUNTER — Other Ambulatory Visit: Payer: Self-pay | Admitting: Internal Medicine

## 2021-12-26 ENCOUNTER — Encounter: Payer: Self-pay | Admitting: Internal Medicine

## 2021-12-26 DIAGNOSIS — Z6833 Body mass index (BMI) 33.0-33.9, adult: Secondary | ICD-10-CM

## 2021-12-26 DIAGNOSIS — I10 Essential (primary) hypertension: Secondary | ICD-10-CM

## 2021-12-26 DIAGNOSIS — Z9884 Bariatric surgery status: Secondary | ICD-10-CM

## 2021-12-26 DIAGNOSIS — E669 Obesity, unspecified: Secondary | ICD-10-CM

## 2021-12-26 NOTE — Telephone Encounter (Signed)
Please sent prescription for Liraglutide -Weight Management ([place]) 18 MG/3ML SOPN to [facility] in [place], [place]   Fax # [phone]

## 2021-12-27 ENCOUNTER — Other Ambulatory Visit: Payer: Self-pay | Admitting: Internal Medicine

## 2021-12-27 DIAGNOSIS — Z6833 Body mass index (BMI) 33.0-33.9, adult: Secondary | ICD-10-CM

## 2021-12-27 DIAGNOSIS — E669 Obesity, unspecified: Secondary | ICD-10-CM

## 2021-12-27 DIAGNOSIS — Z9884 Bariatric surgery status: Secondary | ICD-10-CM

## 2021-12-27 DIAGNOSIS — I10 Essential (primary) hypertension: Secondary | ICD-10-CM

## 2021-12-27 MED ORDER — PEN NEEDLES 31G X 6 MM MISC: 1.0000 | Freq: Every day | 3 refills | Status: AC

## 2021-12-27 MED ORDER — SAXENDA 18 MG/3ML ~~LOC~~ SOPN: 0.6000 mg | PEN_INJECTOR | Freq: Every day | SUBCUTANEOUS | 11 refills | Status: AC

## 2022-01-23 DIAGNOSIS — H401132 Primary open-angle glaucoma, bilateral, moderate stage: Secondary | ICD-10-CM | POA: Diagnosis not present

## 2022-01-30 DIAGNOSIS — F331 Major depressive disorder, recurrent, moderate: Secondary | ICD-10-CM | POA: Diagnosis not present

## 2022-02-06 DIAGNOSIS — F331 Major depressive disorder, recurrent, moderate: Secondary | ICD-10-CM | POA: Diagnosis not present

## 2022-02-13 DIAGNOSIS — F329 Major depressive disorder, single episode, unspecified: Secondary | ICD-10-CM | POA: Diagnosis not present

## 2022-02-13 DIAGNOSIS — F331 Major depressive disorder, recurrent, moderate: Secondary | ICD-10-CM | POA: Diagnosis not present

## 2022-02-13 DIAGNOSIS — F419 Anxiety disorder, unspecified: Secondary | ICD-10-CM | POA: Diagnosis not present

## 2022-03-13 DIAGNOSIS — F331 Major depressive disorder, recurrent, moderate: Secondary | ICD-10-CM | POA: Diagnosis not present

## 2022-03-18 ENCOUNTER — Other Ambulatory Visit (HOSPITAL_COMMUNITY): Payer: Self-pay

## 2022-03-20 DIAGNOSIS — F331 Major depressive disorder, recurrent, moderate: Secondary | ICD-10-CM | POA: Diagnosis not present

## 2022-03-27 DIAGNOSIS — F331 Major depressive disorder, recurrent, moderate: Secondary | ICD-10-CM | POA: Diagnosis not present

## 2022-04-03 DIAGNOSIS — H409 Unspecified glaucoma: Secondary | ICD-10-CM | POA: Diagnosis not present

## 2022-04-03 DIAGNOSIS — Z833 Family history of diabetes mellitus: Secondary | ICD-10-CM | POA: Diagnosis not present

## 2022-04-03 DIAGNOSIS — F1020 Alcohol dependence, uncomplicated: Secondary | ICD-10-CM | POA: Diagnosis not present

## 2022-04-03 DIAGNOSIS — E669 Obesity, unspecified: Secondary | ICD-10-CM | POA: Diagnosis not present

## 2022-04-03 DIAGNOSIS — F419 Anxiety disorder, unspecified: Secondary | ICD-10-CM | POA: Diagnosis not present

## 2022-04-07 DIAGNOSIS — B349 Viral infection, unspecified: Secondary | ICD-10-CM | POA: Diagnosis not present

## 2022-04-07 DIAGNOSIS — R062 Wheezing: Secondary | ICD-10-CM | POA: Diagnosis not present

## 2022-04-07 DIAGNOSIS — R051 Acute cough: Secondary | ICD-10-CM | POA: Diagnosis not present

## 2022-04-07 DIAGNOSIS — I10 Essential (primary) hypertension: Secondary | ICD-10-CM | POA: Diagnosis not present

## 2022-04-10 DIAGNOSIS — F331 Major depressive disorder, recurrent, moderate: Secondary | ICD-10-CM | POA: Diagnosis not present

## 2022-04-16 DIAGNOSIS — B9689 Other specified bacterial agents as the cause of diseases classified elsewhere: Secondary | ICD-10-CM | POA: Diagnosis not present

## 2022-04-16 DIAGNOSIS — J019 Acute sinusitis, unspecified: Secondary | ICD-10-CM | POA: Diagnosis not present

## 2022-04-17 DIAGNOSIS — F329 Major depressive disorder, single episode, unspecified: Secondary | ICD-10-CM | POA: Diagnosis not present

## 2022-05-30 DEATH — deceased

## 2022-06-17 ENCOUNTER — Other Ambulatory Visit (HOSPITAL_COMMUNITY): Payer: Self-pay
